# Patient Record
Sex: Male | Born: 1945 | Race: White | Hispanic: No | Marital: Married | Smoking: Former smoker
Health system: Southern US, Community
[De-identification: ages and names within clinical notes are randomized; demographics above are authoritative.]

## PROBLEM LIST (undated history)

## (undated) ENCOUNTER — Ambulatory Visit: Admission: EM | Payer: Medicare Other | Source: Home / Self Care

## (undated) DIAGNOSIS — Z9889 Other specified postprocedural states: Secondary | ICD-10-CM

## (undated) DIAGNOSIS — C801 Malignant (primary) neoplasm, unspecified: Secondary | ICD-10-CM

## (undated) DIAGNOSIS — E785 Hyperlipidemia, unspecified: Secondary | ICD-10-CM

## (undated) DIAGNOSIS — R42 Dizziness and giddiness: Secondary | ICD-10-CM

## (undated) DIAGNOSIS — I10 Essential (primary) hypertension: Secondary | ICD-10-CM

## (undated) DIAGNOSIS — K219 Gastro-esophageal reflux disease without esophagitis: Secondary | ICD-10-CM

## (undated) DIAGNOSIS — E559 Vitamin D deficiency, unspecified: Secondary | ICD-10-CM

## (undated) HISTORY — PX: OTHER SURGICAL HISTORY: SHX169

## (undated) HISTORY — PX: APPENDECTOMY: SHX54

## (undated) HISTORY — PX: RHINOPLASTY: SUR1284

## (undated) HISTORY — PX: TONSILLECTOMY: SUR1361

---

## 2011-06-28 HISTORY — PX: SHOULDER ARTHROSCOPY: SHX128

## 2012-06-27 HISTORY — PX: REVISION UROSTOMY CUTANEOUS: SUR1282

## 2014-06-27 DIAGNOSIS — A491 Streptococcal infection, unspecified site: Secondary | ICD-10-CM

## 2014-06-27 HISTORY — DX: Streptococcal infection, unspecified site: A49.1

## 2016-12-05 ENCOUNTER — Ambulatory Visit
Admission: EM | Admit: 2016-12-05 | Discharge: 2016-12-05 | Disposition: A | Discharge status: Home / Self Care | Payer: Medicare Other | Attending: Family Medicine | Admitting: Family Medicine

## 2016-12-05 DIAGNOSIS — S61216A Laceration without foreign body of right little finger without damage to nail, initial encounter: Secondary | ICD-10-CM

## 2016-12-05 DIAGNOSIS — W540XXA Bitten by dog, initial encounter: Secondary | ICD-10-CM | POA: Diagnosis not present

## 2016-12-05 MED ORDER — AMOXICILLIN-POT CLAVULANATE 875-125 MG PO TABS: 1.0000 | ORAL_TABLET | Freq: Two times a day (BID) | ORAL | 0 refills | Status: DC

## 2016-12-05 MED ORDER — TETANUS-DIPHTH-ACELL PERTUSSIS 5-2.5-18.5 LF-MCG/0.5 IM SUSP
0.5000 mL | Freq: Once | INTRAMUSCULAR | Status: AC
  Administered 2016-12-05: 0.5 mL via INTRAMUSCULAR

## 2016-12-05 NOTE — ED Notes (Signed)
Spoke to animal control and reported the dog bite, they will follow up with the patient.

## 2016-12-05 NOTE — ED Provider Notes (Signed)
[facility]    CSN: [id number] Arrival date & time: 12/05/16  1126     History   Chief Complaint Chief Complaint  Patient presents with  . Animal Bite    HPI Landry Kamath is a 71 y.o. male.   71 yo male presents with a c/o right hand laceration to his pinky finger and left wrist/forearm abrasion due to dog bite last night.  Also has an abrasion to his abdomen from the dog paw. States he was bitten last night by his own dog when he reached to take away something from his mouth. Patient states his dog is up to date on his immunizations. Patient does not recall his own last tetanus shot. States he cleaned the wounds.   The history is provided by the patient.    History reviewed. No pertinent past medical history.  There are no active problems to display for this patient.   History reviewed. No pertinent surgical history.     Home Medications    Prior to Admission medications   Medication Sig Start Date End Date Taking? Authorizing Provider  amoxicillin-clavulanate (AUGMENTIN) 875-125 MG tablet Take 1 tablet by mouth 2 (two) times daily. 12/05/16   Norval Gable, MD    Family History History reviewed. No pertinent family history.  Social History Social History  Substance Use Topics  . Smoking status: Never Smoker  . Smokeless tobacco: Never Used  . Alcohol use No     Allergies   Patient has no known allergies.   Review of Systems Review of Systems   Physical Exam Triage Vital Signs ED Triage Vitals [12/05/16 1251]  Enc Vitals Group     BP 134/64     Pulse Rate 68     Resp 18     Temp 97.9 F (36.6 C)     Temp Source Oral     SpO2 100 %     Weight 165 lb (74.8 kg)     Height 5\' 6"  (1.676 m)     Head Circumference      Peak Flow      Pain Score 8     Pain Loc      Pain Edu?      Excl. in [place]?    No data found.   Updated Vital Signs BP 134/64 (BP Location: Left Arm)   Pulse 68   Temp 97.9 F (36.6 C) (Oral)    Resp 18   Ht 5\' 6"  (1.676 m)   Wt 165 lb (74.8 kg)   SpO2 100%   BMI 26.63 kg/m   Visual Acuity Right Eye Distance:   Left Eye Distance:   Bilateral Distance:    Right Eye Near:   Left Eye Near:    Bilateral Near:     Physical Exam  Constitutional: He appears well-developed and well-nourished. No distress.  Musculoskeletal:       Left forearm: He exhibits laceration (3cm superficial).       Right hand: He exhibits laceration (2cm to 5th finger; normal ROM and stregnth ). He exhibits normal range of motion, no tenderness, no bony tenderness, normal two-point discrimination, normal capillary refill, no deformity and no swelling. Normal sensation noted. Normal strength noted.       Hands: Skin: He is not diaphoretic.  Nursing note and vitals reviewed.    UC Treatments / Results  Labs (all labs ordered are listed, but only abnormal results are displayed) Labs Reviewed - No data to display  EKG  EKG Interpretation None       Radiology No results found.  Procedures Procedures (including critical care time)  Medications Ordered in UC Medications  Tdap (BOOSTRIX) injection 0.5 mL (0.5 mLs Intramuscular Given 12/05/16 1312)     Initial Impression / Assessment and Plan / UC Course  I have reviewed the triage vital signs and the nursing notes.  Pertinent labs & imaging results that were available during my care of the patient were reviewed by me and considered in my medical decision making (see chart for details).       Final Clinical Impressions(s) / UC Diagnoses   Final diagnoses:  Dog bite, initial encounter  Laceration of right little finger without foreign body without damage to nail, initial encounter    New Prescriptions Discharge Medication List as of 12/05/2016  1:28 PM    START taking these medications   Details  amoxicillin-clavulanate (AUGMENTIN) 875-125 MG tablet Take 1 tablet by mouth 2 (two) times daily., Starting Mon 12/05/2016, Normal         1. diagnosis reviewed with patient 2. rx as per orders above; reviewed possible side effects, interactions, risks and benefits  3. Steri strips applied to right small finger laceration with loose approximation of wound edges 4. Recommend supportive treatment with wound care 5. Patient given Tdap 6. Animal control notified 7. Follow-up prn if symptoms worsen or don't improve   Norval Gable, MD 12/05/16 1624

## 2016-12-05 NOTE — ED Triage Notes (Signed)
Pt was bitten by his dog last night on his left wrist, right hand and right pinky finger, and scratch to his abdomen.

## 2018-07-28 ENCOUNTER — Ambulatory Visit
Admission: EM | Admit: 2018-07-28 | Discharge: 2018-07-28 | Disposition: A | Discharge status: Home / Self Care | Payer: Medicare Other | Attending: Family Medicine | Admitting: Family Medicine

## 2018-07-28 DIAGNOSIS — J988 Other specified respiratory disorders: Secondary | ICD-10-CM

## 2018-07-28 DIAGNOSIS — B9789 Other viral agents as the cause of diseases classified elsewhere: Secondary | ICD-10-CM

## 2018-07-28 MED ORDER — BENZONATATE 100 MG PO CAPS: 100.0000 mg | ORAL_CAPSULE | Freq: Three times a day (TID) | ORAL | 0 refills | Status: DC | PRN

## 2018-07-28 MED ORDER — PREDNISONE 50 MG PO TABS: ORAL_TABLET | ORAL | 0 refills | Status: DC
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
## 2018-07-28 NOTE — ED Triage Notes (Signed)
Pt states he has had a cough for 1 week that he cannot seem to get rid of. States its worse in the morning and when he is about to go to sleep. Thinks it has settle into his chest. Has been taking otc cold medication.

## 2018-07-28 NOTE — Discharge Instructions (Signed)
This is viral and will slowly improve.  No need for antibiotics.  Take the prednisone and use the cough medication as needed.  Rest.  Take care  Dr. Lacinda Axon

## 2019-07-08 ENCOUNTER — Other Ambulatory Visit: Payer: Self-pay

## 2019-07-08 ENCOUNTER — Other Ambulatory Visit
Admission: RE | Admit: 2019-07-08 | Discharge: 2019-07-08 | Disposition: A | Discharge status: Home / Self Care | Payer: Medicare Other | Source: Ambulatory Visit | Attending: Internal Medicine | Admitting: Internal Medicine

## 2019-07-08 DIAGNOSIS — Z01812 Encounter for preprocedural laboratory examination: Secondary | ICD-10-CM | POA: Insufficient documentation

## 2019-07-08 DIAGNOSIS — Z20822 Contact with and (suspected) exposure to covid-19: Secondary | ICD-10-CM | POA: Insufficient documentation

## 2019-07-08 LAB — SARS CORONAVIRUS 2 (TAT 6-24 HRS): SARS Coronavirus 2: NEGATIVE

## 2019-07-09 ENCOUNTER — Encounter: Payer: Self-pay | Admitting: Internal Medicine

## 2019-07-10 ENCOUNTER — Ambulatory Visit
Admission: RE | Admit: 2019-07-10 | Discharge: 2019-07-10 | Disposition: A | Discharge status: Home / Self Care | Payer: Medicare Other | Attending: Internal Medicine | Admitting: Internal Medicine

## 2019-07-10 ENCOUNTER — Encounter: Payer: Self-pay | Admitting: Internal Medicine

## 2019-07-10 ENCOUNTER — Encounter
Admission: RE | Disposition: A | Discharge status: Home / Self Care | Payer: Self-pay | Source: Home / Self Care | Attending: Internal Medicine

## 2019-07-10 ENCOUNTER — Other Ambulatory Visit: Payer: Self-pay

## 2019-07-10 ENCOUNTER — Ambulatory Visit: Payer: Medicare Other | Admitting: Registered Nurse

## 2019-07-10 DIAGNOSIS — K5730 Diverticulosis of large intestine without perforation or abscess without bleeding: Secondary | ICD-10-CM | POA: Diagnosis not present

## 2019-07-10 DIAGNOSIS — Z8551 Personal history of malignant neoplasm of bladder: Secondary | ICD-10-CM | POA: Diagnosis not present

## 2019-07-10 DIAGNOSIS — K640 First degree hemorrhoids: Secondary | ICD-10-CM | POA: Insufficient documentation

## 2019-07-10 DIAGNOSIS — K921 Melena: Secondary | ICD-10-CM | POA: Insufficient documentation

## 2019-07-10 DIAGNOSIS — D128 Benign neoplasm of rectum: Secondary | ICD-10-CM | POA: Insufficient documentation

## 2019-07-10 DIAGNOSIS — Z87891 Personal history of nicotine dependence: Secondary | ICD-10-CM | POA: Insufficient documentation

## 2019-07-10 HISTORY — PX: COLONOSCOPY WITH PROPOFOL: SHX5780

## 2019-07-10 HISTORY — DX: Dizziness and giddiness: R42

## 2019-07-10 HISTORY — DX: Vitamin D deficiency, unspecified: E55.9

## 2019-07-10 HISTORY — DX: Malignant (primary) neoplasm, unspecified: C80.1

## 2019-07-10 SURGERY — COLONOSCOPY WITH PROPOFOL
Anesthesia: General

## 2019-07-10 MED ORDER — SODIUM CHLORIDE 0.9 % IV SOLN
INTRAVENOUS | Status: DC
  Administered 2019-07-10: 10:00:00 1000 mL via INTRAVENOUS

## 2019-07-10 MED ORDER — PROPOFOL 500 MG/50ML IV EMUL
INTRAVENOUS | Status: DC | PRN
  Administered 2019-07-10: 150 ug/kg/min via INTRAVENOUS

## 2019-07-10 MED ORDER — PROPOFOL 10 MG/ML IV BOLUS
INTRAVENOUS | Status: DC | PRN
  Administered 2019-07-10: 20 mg via INTRAVENOUS
  Administered 2019-07-10: 70 mg via INTRAVENOUS

## 2019-07-10 MED ORDER — LIDOCAINE HCL (CARDIAC) PF 100 MG/5ML IV SOSY
PREFILLED_SYRINGE | INTRAVENOUS | Status: DC | PRN
  Administered 2019-07-10: 40 mg via INTRAVENOUS

## 2019-07-10 NOTE — Interval H&P Note (Signed)
History and Physical Interval Note:  07/10/2019 11:08 AM  Hector Bryant  has presented today for surgery, with the diagnosis of RECTAL BLEED.  The various methods of treatment have been discussed with the patient and family. After consideration of risks, benefits and other options for treatment, the patient has consented to  Procedure(s): COLONOSCOPY WITH PROPOFOL (N/A) as a surgical intervention.  The patient's history has been reviewed, patient examined, no change in status, stable for surgery.  I have reviewed the patient's chart and labs.  Questions were answered to the patient's satisfaction.     [place], [place]

## 2019-07-10 NOTE — Transfer of Care (Signed)
Immediate Anesthesia Transfer of Care Note  Patient: Hector Bryant  Procedure(s) Performed: Procedure(s): COLONOSCOPY WITH PROPOFOL (N/A)  Patient Location: PACU and Endoscopy Unit  Anesthesia Type:General  Level of Consciousness: sedated  Airway & Oxygen Therapy: Patient Spontanous Breathing and Patient connected to nasal cannula oxygen  Post-op Assessment: Report given to RN and Post -op Vital signs reviewed and stable  Post vital signs: Reviewed and stable  Last Vitals:  Vitals:   07/10/19 1008 07/10/19 1130  BP: (!) 149/76 122/86  Pulse: 72 66  Resp: 18 (!) 21  Temp: (!) 36.3 C (!) 36.1 C  SpO2: 123456 0000000    Complications: No apparent anesthesia complications

## 2019-07-10 NOTE — Op Note (Signed)
[facility] Gastroenterology Patient Name: Hector Bryant Procedure Date: 07/10/2019 11:03 AM MRN: [id number] Account #: [id number] Date of Birth: 13-Sep-1945 Admit Type: Outpatient Age: 74 Room: [facility] ENDO ROOM 3 Gender: Male Note Status: Finalized Procedure:             Colonoscopy Indications:           Hematochezia Providers:             Benay Pike. Alice Reichert MD, MD Referring MD:          Rubbie Battiest. Iona Beard MD, MD (Referring MD) Medicines:             Propofol per Anesthesia Complications:         No immediate complications. Procedure:             Pre-Anesthesia Assessment:                        - The risks and benefits of the procedure and the                         sedation options and risks were discussed with the                         patient. All questions were answered and informed                         consent was obtained.                        - Patient identification and proposed procedure were                         verified prior to the procedure by the nurse. The                         procedure was verified in the procedure room.                        - ASA Grade Assessment: III - A patient with severe                         systemic disease.                        - After reviewing the risks and benefits, the patient                         was deemed in satisfactory condition to undergo the                         procedure.                        After obtaining informed consent, the colonoscope was                         passed under direct vision. Throughout the procedure,                         the patient's blood pressure, pulse, and oxygen  saturations were monitored continuously. The                         Colonoscope was introduced through the anus and                         advanced to the the cecum, identified by appendiceal                         orifice and ileocecal valve. The colonoscopy was                      performed without difficulty. The patient tolerated                         the procedure well. The quality of the bowel                         preparation was excellent. The ileocecal valve,                         appendiceal orifice, and rectum were photographed. Findings:      The perianal and digital rectal examinations were normal. Pertinent       negatives include normal sphincter tone and no palpable rectal lesions.      Non-bleeding internal hemorrhoids were found during retroflexion. The       hemorrhoids were Grade I (internal hemorrhoids that do not prolapse).      A few small-mouthed diverticula were found in the sigmoid colon. There       was no evidence of diverticular bleeding.      A thickened fold was noted in the area of the proximal rectum. Biopsy x       2 was taken to rule out adenoma.      The exam was otherwise without abnormality. Impression:            - Non-bleeding internal hemorrhoids.                        - Mild diverticulosis in the sigmoid colon. There was                         no evidence of diverticular bleeding.                        - The examination was otherwise normal.                        - No specimens collected. Recommendation:        - Patient has a contact number available for                         emergencies. The signs and symptoms of potential                         delayed complications were discussed with the patient.                         Return to normal activities tomorrow. Written  discharge instructions were provided to the patient.                        - Resume previous diet.                        - Continue present medications.                        - Await pathology results.                        - Repeat colonoscopy is recommended for surveillance.                         The colonoscopy date will be determined after                         pathology results from today's  exam become available                         for review.                        - Return to GI office PRN.                        - The findings and recommendations were discussed with                         the patient. Procedure Code(s):     --- Professional ---                        [phone], Colonoscopy, flexible; diagnostic, including                         collection of specimen(s) by brushing or washing, when                         performed (separate procedure) Diagnosis Code(s):     --- Professional ---                        K57.30, Diverticulosis of large intestine without                         perforation or abscess without bleeding                        K92.1, Melena (includes Hematochezia)                        K64.0, First degree hemorrhoids CPT copyright 2019 American Medical Association. All rights reserved. The codes documented in this report are preliminary and upon coder review may  be revised to meet current compliance requirements. Efrain Sella MD, MD 07/10/2019 11:30:56 AM This report has been signed electronically. Number of Addenda: 0 Note Initiated On: 07/10/2019 11:03 AM Scope Withdrawal Time: 0 hours 6 minutes 4 seconds  Total Procedure Duration: 0 hours 8 minutes 28 seconds  Estimated Blood Loss:  Estimated blood loss: none.      [facility]

## 2019-07-10 NOTE — Anesthesia Procedure Notes (Signed)
Date/Time: 07/10/2019 11:10 AM Performed by: Doreen Salvage, CRNA Pre-anesthesia Checklist: Patient identified, Emergency Drugs available, Suction available and Patient being monitored Patient Re-evaluated:Patient Re-evaluated prior to induction Oxygen Delivery Method: Nasal cannula Induction Type: IV induction Dental Injury: Teeth and Oropharynx as per pre-operative assessment  Comments: Nasal cannula with etCO2 monitoring

## 2019-07-10 NOTE — H&P (Signed)
Outpatient short stay form Pre-procedure 07/10/2019 11:05 AM Hector Bryant K. Alice Reichert, M.D.  Primary Physician: Salome Holmes, M.D.  Reason for visit:  Hematochezia  History of present illness:  74 y/o male presents with reported history of recent frank anal and rectal hemorrhage at home that resolved. Last colonoscopy was performed in [place], [place] greater than 5 years ago without reported abnormalities by the patient.     Current Facility-Administered Medications:  .  0.9 %  sodium chloride infusion, , Intravenous, Continuous, [place], Benay Pike, MD, Last Rate: 20 mL/hr at 07/10/19 1027, 1,000 mL at 07/10/19 1027  Medications Prior to Admission  Medication Sig Dispense Refill Last Dose  . atorvastatin (LIPITOR) 20 MG tablet    07/09/2019 at Unknown time  . ergocalciferol (VITAMIN D2) 1.25 MG (50000 UT) capsule Take 50,000 Units by mouth once a week.   Past Week at Unknown time  . ibuprofen (ADVIL) 800 MG tablet Take 800 mg by mouth every 8 (eight) hours as needed.   Past Week at Unknown time  . benzonatate (TESSALON) 100 MG capsule Take 1 capsule (100 mg total) by mouth 3 (three) times daily as needed. (Patient not taking: Reported on 07/10/2019) 30 capsule 0 Not Taking at Unknown time  . predniSONE (DELTASONE) 50 MG tablet 1 tablet daily x 5 days (Patient not taking: Reported on 07/10/2019) 5 tablet 0 Completed Course at Unknown time     No Known Allergies   Past Medical History:  Diagnosis Date  . Cancer (HCC)    BLADDER  . Vertigo   . Vitamin D insufficiency     Review of systems:  Otherwise negative.    Physical Exam  Gen: Alert, oriented. Appears stated age.  HEENT: [place]/AT. PERRLA. Lungs: CTA, no wheezes. CV: RR nl S1, S2. Abd: soft, benign, no masses. BS+ Ext: No edema. Pulses 2+    Planned procedures: Proceed with colonoscopy. The patient understands the nature of the planned procedure, indications, risks, alternatives and potential complications including but not  limited to bleeding, infection, perforation, damage to internal organs and possible oversedation/side effects from anesthesia. The patient agrees and gives consent to proceed.  Please refer to procedure notes for findings, recommendations and patient disposition/instructions.     Verenise Moulin K. Alice Reichert, M.D. Gastroenterology 07/10/2019  11:05 AM

## 2019-07-11 LAB — SURGICAL PATHOLOGY

## 2019-07-11 NOTE — Anesthesia Postprocedure Evaluation (Signed)
Anesthesia Post Note  Patient: Hector Bryant  Procedure(s) Performed: COLONOSCOPY WITH PROPOFOL (N/A )  Patient location during evaluation: PACU Anesthesia Type: General Level of consciousness: awake and alert Pain management: pain level controlled Vital Signs Assessment: post-procedure vital signs reviewed and stable Respiratory status: spontaneous breathing, nonlabored ventilation, respiratory function stable and patient connected to nasal cannula oxygen Cardiovascular status: blood pressure returned to baseline and stable Postop Assessment: no apparent nausea or vomiting Anesthetic complications: no     Last Vitals:  Vitals:   07/10/19 1008 07/10/19 1130  BP: (!) 149/76 122/86  Pulse: 72 66  Resp: 18 (!) 21  Temp: (!) 36.3 C (!) 36.1 C  SpO2: 99% 97%    Last Pain:  Vitals:   07/10/19 1200  TempSrc:   PainSc: 0-No pain                 Molli Barrows
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
## 2019-09-13 ENCOUNTER — Ambulatory Visit
Admission: EM | Admit: 2019-09-13 | Discharge: 2019-09-13 | Disposition: A | Discharge status: Home / Self Care | Payer: Medicare Other | Attending: Urgent Care | Admitting: Urgent Care

## 2019-09-13 ENCOUNTER — Encounter: Payer: Self-pay | Admitting: Emergency Medicine

## 2019-09-13 ENCOUNTER — Other Ambulatory Visit: Payer: Self-pay

## 2019-09-13 DIAGNOSIS — R1011 Right upper quadrant pain: Secondary | ICD-10-CM

## 2019-09-13 DIAGNOSIS — R110 Nausea: Secondary | ICD-10-CM

## 2019-09-13 HISTORY — DX: Hyperlipidemia, unspecified: E78.5

## 2019-09-13 LAB — CBC
HCT: 36.5 % — ABNORMAL LOW (ref 39.0–52.0)
Hemoglobin: 12.8 g/dL — ABNORMAL LOW (ref 13.0–17.0)
MCH: 32.2 pg (ref 26.0–34.0)
MCHC: 35.1 g/dL (ref 30.0–36.0)
MCV: 91.7 fL (ref 80.0–100.0)
Platelets: 234 10*3/uL (ref 150–400)
RBC: 3.98 MIL/uL — ABNORMAL LOW (ref 4.22–5.81)
RDW: 13.5 % (ref 11.5–15.5)
WBC: 5.3 10*3/uL (ref 4.0–10.5)
nRBC: 0 % (ref 0.0–0.2)

## 2019-09-13 LAB — COMPREHENSIVE METABOLIC PANEL
ALT: 24 U/L (ref 0–44)
AST: 42 U/L — ABNORMAL HIGH (ref 15–41)
Albumin: 3.8 g/dL (ref 3.5–5.0)
Alkaline Phosphatase: 39 U/L (ref 38–126)
Anion gap: 8 (ref 5–15)
BUN: 13 mg/dL (ref 8–23)
CO2: 24 mmol/L (ref 22–32)
Calcium: 9.1 mg/dL (ref 8.9–10.3)
Chloride: 107 mmol/L (ref 98–111)
Creatinine, Ser: 1.2 mg/dL (ref 0.61–1.24)
GFR calc Af Amer: >60 mL/min (ref >60)
GFR calc non Af Amer: 59 mL/min — ABNORMAL LOW (ref >60)
Glucose, Bld: 102 mg/dL — ABNORMAL HIGH (ref 70–99)
Potassium: 4.7 mmol/L (ref 3.5–5.1)
Sodium: 139 mmol/L (ref 135–145)
Total Bilirubin: 1.2 mg/dL (ref 0.3–1.2)
Total Protein: 6.9 g/dL (ref 6.5–8.1)

## 2019-09-13 LAB — LIPASE, BLOOD: Lipase: 30 U/L (ref 11–51)

## 2019-09-13 MED ORDER — TRAMADOL HCL 50 MG PO TABS: 50.0000 mg | ORAL_TABLET | Freq: Two times a day (BID) | ORAL | 0 refills | Status: DC | PRN

## 2019-09-13 NOTE — Discharge Instructions (Addendum)
It was very nice seeing you today in clinic. Thank you for entrusting me with your care.   Your labs today were all normal. With the location and duration of your pain, I think that this is likely musculoskeletal in nature. Apply heating pain to area to help with your pain. Continue ibuprofen as needed. Will send in something a little stronger for use for severe pain; be careful with this medication as it can make you sleepy. You can take 1/2 to 1 whole pill twice a day if you need it.   Make arrangements to follow up with your regular doctor in 1 week for re-evaluation if not improving. If your symptoms/condition worsens, please seek follow up care either here or in the ER. Please remember, our [place] providers are "right here with you" when you need [place].   Again, it was my pleasure to take care of you today. Thank you for choosing our clinic. I hope that you start to feel better quickly.   Honor Loh, MSN, APRN, FNP-C, CEN Advanced Practice Provider [facility]

## 2019-09-13 NOTE — ED Triage Notes (Signed)
Patient in today c/o RUQ abdominal pain x 2 weeks. 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 Patient states he feels slightly nauseous. Denies any changes in bowel habits or urinary symptoms.
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
## 2020-02-28 ENCOUNTER — Other Ambulatory Visit: Payer: Self-pay | Admitting: Orthopedic Surgery

## 2020-02-28 DIAGNOSIS — M2392 Unspecified internal derangement of left knee: Secondary | ICD-10-CM

## 2020-02-28 DIAGNOSIS — M1712 Unilateral primary osteoarthritis, left knee: Secondary | ICD-10-CM

## 2020-03-20 ENCOUNTER — Other Ambulatory Visit: Payer: Self-pay

## 2020-03-20 ENCOUNTER — Ambulatory Visit
Admission: RE | Admit: 2020-03-20 | Discharge: 2020-03-20 | Disposition: A | Discharge status: Home / Self Care | Payer: Medicare Other | Source: Ambulatory Visit | Attending: Orthopedic Surgery | Admitting: Orthopedic Surgery

## 2020-03-20 DIAGNOSIS — M2392 Unspecified internal derangement of left knee: Secondary | ICD-10-CM | POA: Diagnosis not present

## 2020-03-20 DIAGNOSIS — M1712 Unilateral primary osteoarthritis, left knee: Secondary | ICD-10-CM | POA: Diagnosis present

## 2020-06-04 ENCOUNTER — Other Ambulatory Visit: Payer: Self-pay | Admitting: Surgery

## 2020-06-09 ENCOUNTER — Other Ambulatory Visit: Payer: Self-pay

## 2020-06-09 ENCOUNTER — Encounter
Admission: RE | Admit: 2020-06-09 | Discharge: 2020-06-09 | Disposition: A | Discharge status: Home / Self Care | Payer: Medicare Other | Source: Ambulatory Visit | Attending: Surgery | Admitting: Surgery

## 2020-06-09 DIAGNOSIS — Z01818 Encounter for other preprocedural examination: Secondary | ICD-10-CM | POA: Insufficient documentation

## 2020-06-09 LAB — COMPREHENSIVE METABOLIC PANEL
ALT: 21 U/L (ref 0–44)
AST: 23 U/L (ref 15–41)
Albumin: 4.5 g/dL (ref 3.5–5.0)
Alkaline Phosphatase: 34 U/L — ABNORMAL LOW (ref 38–126)
Anion gap: 9 (ref 5–15)
BUN: 18 mg/dL (ref 8–23)
CO2: 26 mmol/L (ref 22–32)
Calcium: 9.8 mg/dL (ref 8.9–10.3)
Chloride: 105 mmol/L (ref 98–111)
Creatinine, Ser: 1.05 mg/dL (ref 0.61–1.24)
GFR, Estimated: >60 mL/min (ref >60)
Glucose, Bld: 91 mg/dL (ref 70–99)
Potassium: 4 mmol/L (ref 3.5–5.1)
Sodium: 140 mmol/L (ref 135–145)
Total Bilirubin: 1 mg/dL (ref 0.3–1.2)
Total Protein: 8 g/dL (ref 6.5–8.1)

## 2020-06-09 LAB — CBC WITH DIFFERENTIAL/PLATELET
Abs Immature Granulocytes: 0.05 10*3/uL (ref 0.00–0.07)
Basophils Absolute: 0.1 10*3/uL (ref 0.0–0.1)
Basophils Relative: 1 %
Eosinophils Absolute: 0.2 10*3/uL (ref 0.0–0.5)
Eosinophils Relative: 3 %
HCT: 41 % (ref 39.0–52.0)
Hemoglobin: 14 g/dL (ref 13.0–17.0)
Immature Granulocytes: 1 %
Lymphocytes Relative: 25 %
Lymphs Abs: 1.5 10*3/uL (ref 0.7–4.0)
MCH: 31.7 pg (ref 26.0–34.0)
MCHC: 34.1 g/dL (ref 30.0–36.0)
MCV: 93 fL (ref 80.0–100.0)
Monocytes Absolute: 0.6 10*3/uL (ref 0.1–1.0)
Monocytes Relative: 10 %
Neutro Abs: 3.7 10*3/uL (ref 1.7–7.7)
Neutrophils Relative %: 60 %
Platelets: 197 10*3/uL (ref 150–400)
RBC: 4.41 MIL/uL (ref 4.22–5.81)
RDW: 13.4 % (ref 11.5–15.5)
WBC: 6.2 10*3/uL (ref 4.0–10.5)
nRBC: 0 % (ref 0.0–0.2)

## 2020-06-09 LAB — URINALYSIS, ROUTINE W REFLEX MICROSCOPIC
Bilirubin Urine: NEGATIVE
Glucose, UA: NEGATIVE mg/dL
Hgb urine dipstick: NEGATIVE
Ketones, ur: NEGATIVE mg/dL
Nitrite: POSITIVE — AB
Protein, ur: 100 mg/dL — AB
Specific Gravity, Urine: 1.019 (ref 1.005–1.030)
Squamous Epithelial / HPF: NONE SEEN (ref 0–5)
pH: 8 (ref 5.0–8.0)

## 2020-06-09 NOTE — Progress Notes (Signed)
  [facility] Perioperative Services: Pre-Admission/Anesthesia Testing  Abnormal Lab Notification   Date: 06/09/20  Name: Hector Bryant MRN:   [id number]  Re: Abnormal labs noted during PAT appointment   Provider(s) Notified: Poggi, Marshall Cork, MD Notification mode: Routed and/or faxed via [place] LAB VALUE(S): Lab Results  Component Value Date   COLORURINE YELLOW (A) 06/09/2020   APPEARANCEUR CLOUDY (A) 06/09/2020   LABSPEC 1.019 06/09/2020   PHURINE 8.0 06/09/2020   GLUCOSEU NEGATIVE 06/09/2020   HGBUR NEGATIVE 06/09/2020   BILIRUBINUR NEGATIVE 06/09/2020   KETONESUR NEGATIVE 06/09/2020   PROTEINUR 100 (A) 06/09/2020   NITRITE POSITIVE (A) 06/09/2020   LEUKOCYTESUR TRACE (A) 06/09/2020   EPIU NONE SEEN 06/09/2020   WBCU 21-50 06/09/2020   RBCU 11-20 06/09/2020   BACTERIA MANY (A) 06/09/2020   Notes:  Patient scheduled for LEFT PARTIAL KNEE REPLACEMENT (Left Knee) on 06/17/2020.  UA performed in PAT concerning for infection.   Patient has a urostomy.  . No leukocytosis noted on CBC; WBC 6.2 K/uL.  Marland Kitchen Renal function normal; BUN 18 and creatinine 1.05 mg/dL.  Marland Kitchen Urine C&S added to assess for pathogenically significant growth.  Will forward UA, and subsequent C&S results, to attending surgeon for review and Tx as deemed appropriate. This is a Community education officer; no formal response is required.  Honor Loh, MSN, APRN, FNP-C, CEN [facility]  Peri-operative Services Nurse Practitioner Phone: [phone] Fax: [phone] 06/09/20 1:47 PM

## 2020-06-09 NOTE — Patient Instructions (Addendum)
Your procedure is scheduled on: Wednesday June 17, 2020  Report to Day Surgery inside [facility] 2nd floor (stop by Registration desk first) To find out your arrival time please call [phone] between 1PM - 3PM on Tuesday June 16, 2020.  Remember: Instructions that are not followed completely may result in serious medical risk,  up to and including death, or upon the discretion of your surgeon and anesthesiologist your  surgery may need to be rescheduled.     _X__ 1. Do not eat food after midnight the night before your procedure.                 No chewing gum or hard candies. You may drink clear liquids up to 2 hours                 before you are scheduled to arrive for your surgery- DO not drink clear                 liquids within 2 hours of the start of your surgery.                 Clear Liquids include:  water, apple juice without pulp, clear Gatorade, G2 or                  Gatorade Zero (avoid Red/Purple/Blue), Black Coffee or Tea (Do not add                 anything to coffee or tea).  __X__2.   Complete the "Ensure Clear Pre-surgery Clear Carbohydrate Drink" provided to you, 2 hours before arrival. **If you are diabetic you will be provided with an alternative drink, Gatorade Zero or G2.  __X__3.  On the morning of surgery brush your teeth with toothpaste and water, you                may rinse your mouth with mouthwash if you wish.  Do not swallow any toothpaste of mouthwash.     _X__ 4.  No Alcohol for 24 hours before or after surgery.   _X__ 5.  Do Not Smoke or use e-cigarettes For 24 Hours Prior to Your Surgery.                 Do not use any chewable tobacco products for at least 6 hours prior to                 Surgery.  _X__  6.  Do not use any recreational drugs (marijuana, cocaine, heroin, ecstasy, MDMA or other)                For at least one week prior to your surgery.  Combination of these drugs with anesthesia                 May have life threatening results.  __X__  7.  Notify your doctor if there is any change in your medical condition      (cold, fever, infections).     Do not wear jewelry, make-up, hairpins, clips or nail polish. Do not wear lotions, powders, or perfumes. You may wear deodorant. Do not shave 48 hours prior to surgery. Men may shave face and neck. Do not bring valuables to the hospital.    [facility] is not responsible for any belongings or valuables.  Contacts, dentures or bridgework may not be worn into surgery. Leave your suitcase in the car. After surgery it may  be brought to your room. For patients admitted to the hospital, discharge time is determined by your treatment team.   Patients discharged the day of surgery will not be allowed to drive home.   Make arrangements for someone to be with you for the first 24 hours of your Same Day Discharge.    Please read over the following fact sheets that you were given:   Incentive Spirometry sheet  __x__ Take these medicines the morning of surgery with A SIP OF WATER:    1. None   ____ Fleet Enema (as directed)   __x__ Use CHG Soap (or wipes) as directed  ____ Use Benzoyl Peroxide Gel as instructed  ____ Use inhalers on the day of surgery  ____ Stop metformin 2 days prior to surgery    ____ Take 1/2 of usual insulin dose the night before surgery. No insulin the morning          of surgery.   ____ Stop Coumadin/Plavix/aspirin   __x__ Stop Anti-inflammatories such as Ibuprofen, Aleve, Advil, naproxen, aspirin and or BC powders.    __x__ Stop supplements until after surgery.    __x__ Do no start any herbal supplements before your procedure.    If you have any questions regarding your pre-procedure instructions,  Please call Pre-admit Testing at [phone]. 1

## 2020-06-10 LAB — URINE CULTURE

## 2020-06-15 ENCOUNTER — Other Ambulatory Visit
Admission: RE | Admit: 2020-06-15 | Discharge: 2020-06-15 | Disposition: A | Discharge status: Home / Self Care | Payer: Medicare Other | Source: Ambulatory Visit | Attending: Surgery | Admitting: Surgery

## 2020-06-15 ENCOUNTER — Other Ambulatory Visit: Payer: Self-pay

## 2020-06-15 DIAGNOSIS — Z20822 Contact with and (suspected) exposure to covid-19: Secondary | ICD-10-CM | POA: Diagnosis not present

## 2020-06-15 DIAGNOSIS — Z01812 Encounter for preprocedural laboratory examination: Secondary | ICD-10-CM | POA: Diagnosis present

## 2020-06-15 LAB — SARS CORONAVIRUS 2 (TAT 6-24 HRS): SARS Coronavirus 2: NEGATIVE

## 2020-06-17 ENCOUNTER — Ambulatory Visit: Payer: Medicare Other | Admitting: Urgent Care

## 2020-06-17 ENCOUNTER — Ambulatory Visit: Payer: Medicare Other

## 2020-06-17 ENCOUNTER — Other Ambulatory Visit: Payer: Self-pay

## 2020-06-17 ENCOUNTER — Encounter
Admission: RE | Disposition: A | Discharge status: Home Health | Payer: Self-pay | Source: Home / Self Care | Attending: Surgery

## 2020-06-17 ENCOUNTER — Ambulatory Visit
Admission: RE | Admit: 2020-06-17 | Discharge: 2020-06-18 | Disposition: A | Discharge status: Home Health | Payer: Medicare Other | Attending: Surgery | Admitting: Surgery

## 2020-06-17 ENCOUNTER — Encounter: Payer: Self-pay | Admitting: Surgery

## 2020-06-17 DIAGNOSIS — M1712 Unilateral primary osteoarthritis, left knee: Secondary | ICD-10-CM | POA: Insufficient documentation

## 2020-06-17 DIAGNOSIS — Z96652 Presence of left artificial knee joint: Secondary | ICD-10-CM

## 2020-06-17 DIAGNOSIS — S83242A Other tear of medial meniscus, current injury, left knee, initial encounter: Secondary | ICD-10-CM | POA: Diagnosis not present

## 2020-06-17 DIAGNOSIS — X58XXXA Exposure to other specified factors, initial encounter: Secondary | ICD-10-CM | POA: Diagnosis not present

## 2020-06-17 DIAGNOSIS — Z791 Long term (current) use of non-steroidal anti-inflammatories (NSAID): Secondary | ICD-10-CM | POA: Insufficient documentation

## 2020-06-17 DIAGNOSIS — Z79899 Other long term (current) drug therapy: Secondary | ICD-10-CM | POA: Insufficient documentation

## 2020-06-17 DIAGNOSIS — Z87891 Personal history of nicotine dependence: Secondary | ICD-10-CM | POA: Diagnosis not present

## 2020-06-17 HISTORY — PX: PARTIAL KNEE ARTHROPLASTY: SHX2174

## 2020-06-17 SURGERY — ARTHROPLASTY, KNEE, UNICOMPARTMENTAL
Anesthesia: Monitor Anesthesia Care | Site: Knee | Laterality: Left

## 2020-06-17 MED ORDER — DOCUSATE SODIUM 100 MG PO CAPS
100.0000 mg | ORAL_CAPSULE | Freq: Two times a day (BID) | ORAL | Status: DC
  Administered 2020-06-17 – 2020-06-18 (×2): 100 mg via ORAL
  Filled 2020-06-17 (×3): qty 1

## 2020-06-17 MED ORDER — LACTATED RINGERS IV SOLN: INTRAVENOUS | Status: DC

## 2020-06-17 MED ORDER — OXYCODONE HCL 5 MG PO TABS
5.0000 mg | ORAL_TABLET | ORAL | Status: DC | PRN
  Administered 2020-06-17 (×2): 5 mg via ORAL

## 2020-06-17 MED ORDER — OXYCODONE HCL 5 MG PO TABS
ORAL_TABLET | ORAL | Status: AC
  Filled 2020-06-17: qty 1

## 2020-06-17 MED ORDER — BUPIVACAINE HCL (PF) 0.5 % IJ SOLN
INTRAMUSCULAR | Status: AC
  Filled 2020-06-17: qty 10

## 2020-06-17 MED ORDER — HYDROMORPHONE HCL 1 MG/ML IJ SOLN
INTRAMUSCULAR | Status: AC
  Administered 2020-06-17: 17:00:00 0.5 mg via INTRAVENOUS
  Filled 2020-06-17: qty 1

## 2020-06-17 MED ORDER — ACETAMINOPHEN 325 MG PO TABS: 325.0000 mg | ORAL_TABLET | Freq: Four times a day (QID) | ORAL | Status: DC | PRN

## 2020-06-17 MED ORDER — TRAMADOL HCL 50 MG PO TABS
50.0000 mg | ORAL_TABLET | Freq: Four times a day (QID) | ORAL | Status: DC | PRN
  Administered 2020-06-18: 50 mg via ORAL
  Filled 2020-06-17: qty 1

## 2020-06-17 MED ORDER — HYDROMORPHONE HCL 1 MG/ML IJ SOLN
INTRAMUSCULAR | Status: AC
  Filled 2020-06-17: qty 0.5

## 2020-06-17 MED ORDER — FENTANYL CITRATE (PF) 100 MCG/2ML IJ SOLN
INTRAMUSCULAR | Status: DC | PRN
  Administered 2020-06-17 (×4): 25 ug via INTRAVENOUS

## 2020-06-17 MED ORDER — CHLORHEXIDINE GLUCONATE 0.12 % MT SOLN: 15.0000 mL | Freq: Once | OROMUCOSAL | Status: AC

## 2020-06-17 MED ORDER — SODIUM CHLORIDE 0.9 % BOLUS PEDS
250.0000 mL | Freq: Once | INTRAVENOUS | Status: AC
  Administered 2020-06-17: 10:00:00 250 mL via INTRAVENOUS

## 2020-06-17 MED ORDER — KETOROLAC TROMETHAMINE 30 MG/ML IJ SOLN
15.0000 mg | Freq: Once | INTRAMUSCULAR | Status: AC
  Administered 2020-06-17: 16:00:00 15 mg via INTRAVENOUS

## 2020-06-17 MED ORDER — ONDANSETRON HCL 4 MG PO TABS: 4.0000 mg | ORAL_TABLET | Freq: Four times a day (QID) | ORAL | Status: DC | PRN

## 2020-06-17 MED ORDER — PROPOFOL 10 MG/ML IV BOLUS
INTRAVENOUS | Status: DC | PRN
  Administered 2020-06-17 (×2): 50 mg via INTRAVENOUS
  Administered 2020-06-17: 20 mg via INTRAVENOUS

## 2020-06-17 MED ORDER — ONDANSETRON HCL 4 MG/2ML IJ SOLN
INTRAMUSCULAR | Status: DC | PRN
  Administered 2020-06-17: 4 mg via INTRAVENOUS

## 2020-06-17 MED ORDER — CHLORHEXIDINE GLUCONATE 0.12 % MT SOLN
OROMUCOSAL | Status: AC
  Administered 2020-06-17: 07:00:00 15 mL via OROMUCOSAL
  Filled 2020-06-17: qty 15

## 2020-06-17 MED ORDER — METOCLOPRAMIDE HCL 5 MG/ML IJ SOLN: 5.0000 mg | Freq: Three times a day (TID) | INTRAMUSCULAR | Status: DC | PRN

## 2020-06-17 MED ORDER — LIDOCAINE HCL (CARDIAC) PF 100 MG/5ML IV SOSY
PREFILLED_SYRINGE | INTRAVENOUS | Status: DC | PRN
  Administered 2020-06-17: 50 mg via INTRAVENOUS

## 2020-06-17 MED ORDER — DIPHENHYDRAMINE HCL 12.5 MG/5ML PO ELIX
12.5000 mg | ORAL_SOLUTION | ORAL | Status: DC | PRN
  Filled 2020-06-17: qty 10

## 2020-06-17 MED ORDER — PROPOFOL 500 MG/50ML IV EMUL
INTRAVENOUS | Status: DC | PRN
  Administered 2020-06-17: 125 ug/kg/min via INTRAVENOUS

## 2020-06-17 MED ORDER — TRANEXAMIC ACID-NACL 1000-0.7 MG/100ML-% IV SOLN
INTRAVENOUS | Status: AC | PRN
  Administered 2020-06-17: 1000 mg via INTRAVENOUS

## 2020-06-17 MED ORDER — ACETAMINOPHEN 10 MG/ML IV SOLN
INTRAVENOUS | Status: AC
  Filled 2020-06-17: qty 100

## 2020-06-17 MED ORDER — MIDAZOLAM HCL 5 MG/5ML IJ SOLN
INTRAMUSCULAR | Status: DC | PRN
  Administered 2020-06-17 (×2): 1 mg via INTRAVENOUS

## 2020-06-17 MED ORDER — ONDANSETRON HCL 4 MG/2ML IJ SOLN: 4.0000 mg | Freq: Once | INTRAMUSCULAR | Status: DC | PRN

## 2020-06-17 MED ORDER — CEFAZOLIN SODIUM-DEXTROSE 2-4 GM/100ML-% IV SOLN
2.0000 g | Freq: Four times a day (QID) | INTRAVENOUS | Status: AC
  Administered 2020-06-17 – 2020-06-18 (×2): 2 g via INTRAVENOUS
  Filled 2020-06-17 (×2): qty 100

## 2020-06-17 MED ORDER — ACETAMINOPHEN 500 MG PO TABS
1000.0000 mg | ORAL_TABLET | Freq: Four times a day (QID) | ORAL | Status: DC
  Administered 2020-06-17 (×3): 1000 mg via ORAL
  Filled 2020-06-17 (×3): qty 2

## 2020-06-17 MED ORDER — BUPIVACAINE-EPINEPHRINE (PF) 0.5% -1:200000 IJ SOLN
INTRAMUSCULAR | Status: AC
  Filled 2020-06-17: qty 30

## 2020-06-17 MED ORDER — KETOROLAC TROMETHAMINE 30 MG/ML IJ SOLN
INTRAMUSCULAR | Status: AC
  Filled 2020-06-17: qty 1

## 2020-06-17 MED ORDER — SODIUM CHLORIDE 0.9 % BOLUS PEDS
250.0000 mL | Freq: Once | INTRAVENOUS | Status: AC
  Administered 2020-06-17: 14:00:00 250 mL via INTRAVENOUS

## 2020-06-17 MED ORDER — SODIUM CHLORIDE FLUSH 0.9 % IV SOLN
INTRAVENOUS | Status: AC
  Filled 2020-06-17: qty 20

## 2020-06-17 MED ORDER — ATORVASTATIN CALCIUM 20 MG PO TABS
20.0000 mg | ORAL_TABLET | Freq: Every day | ORAL | Status: DC
  Administered 2020-06-17: 22:00:00 20 mg via ORAL
  Filled 2020-06-17: qty 1

## 2020-06-17 MED ORDER — FENTANYL CITRATE (PF) 100 MCG/2ML IJ SOLN
INTRAMUSCULAR | Status: AC
  Administered 2020-06-17: 16:00:00 25 ug via INTRAVENOUS
  Filled 2020-06-17: qty 2

## 2020-06-17 MED ORDER — FLEET ENEMA 7-19 GM/118ML RE ENEM: 1.0000 | ENEMA | Freq: Once | RECTAL | Status: DC | PRN

## 2020-06-17 MED ORDER — PROPOFOL 10 MG/ML IV BOLUS
INTRAVENOUS | Status: AC
  Filled 2020-06-17: qty 40

## 2020-06-17 MED ORDER — OXYCODONE HCL 5 MG PO TABS
5.0000 mg | ORAL_TABLET | ORAL | 0 refills | Status: DC | PRN
Start: 2020-06-17 — End: 2020-06-18

## 2020-06-17 MED ORDER — ACETAMINOPHEN 500 MG PO TABS
ORAL_TABLET | ORAL | Status: AC
  Filled 2020-06-17: qty 2

## 2020-06-17 MED ORDER — PROPOFOL 500 MG/50ML IV EMUL
INTRAVENOUS | Status: AC
  Filled 2020-06-17: qty 50

## 2020-06-17 MED ORDER — KETOROLAC TROMETHAMINE 15 MG/ML IJ SOLN
7.5000 mg | Freq: Four times a day (QID) | INTRAMUSCULAR | Status: DC
  Administered 2020-06-17 – 2020-06-18 (×3): 7.5 mg via INTRAVENOUS
  Filled 2020-06-17 (×3): qty 1

## 2020-06-17 MED ORDER — MAGNESIUM HYDROXIDE 400 MG/5ML PO SUSP: 30.0000 mL | Freq: Every day | ORAL | Status: DC | PRN

## 2020-06-17 MED ORDER — OXYCODONE HCL 5 MG PO TABS
5.0000 mg | ORAL_TABLET | ORAL | Status: DC | PRN
  Administered 2020-06-18: 10 mg via ORAL
  Filled 2020-06-17 (×2): qty 2

## 2020-06-17 MED ORDER — CYANOCOBALAMIN 500 MCG PO TABS
500.0000 ug | ORAL_TABLET | Freq: Every day | ORAL | Status: DC
  Administered 2020-06-17: 22:00:00 500 ug via ORAL
  Filled 2020-06-17 (×2): qty 1

## 2020-06-17 MED ORDER — BUPIVACAINE HCL (PF) 0.5 % IJ SOLN
INTRAMUSCULAR | Status: DC | PRN
  Administered 2020-06-17: 2.5 mL

## 2020-06-17 MED ORDER — FENTANYL CITRATE (PF) 100 MCG/2ML IJ SOLN
25.0000 ug | INTRAMUSCULAR | Status: DC | PRN
  Administered 2020-06-17 (×2): 25 ug via INTRAVENOUS

## 2020-06-17 MED ORDER — ORAL CARE MOUTH RINSE: 15.0000 mL | Freq: Once | OROMUCOSAL | Status: AC

## 2020-06-17 MED ORDER — ACETAMINOPHEN 10 MG/ML IV SOLN
INTRAVENOUS | Status: DC | PRN
  Administered 2020-06-17: 1000 mg via INTRAVENOUS

## 2020-06-17 MED ORDER — METOCLOPRAMIDE HCL 10 MG PO TABS: 5.0000 mg | ORAL_TABLET | Freq: Three times a day (TID) | ORAL | Status: DC | PRN

## 2020-06-17 MED ORDER — CEFAZOLIN SODIUM-DEXTROSE 2-4 GM/100ML-% IV SOLN
INTRAVENOUS | Status: AC
  Filled 2020-06-17: qty 100

## 2020-06-17 MED ORDER — POTASSIUM CHLORIDE IN NACL 20-0.9 MEQ/L-% IV SOLN
INTRAVENOUS | Status: DC
  Filled 2020-06-17 (×8): qty 1000

## 2020-06-17 MED ORDER — SODIUM CHLORIDE 0.9 % IV SOLN
INTRAVENOUS | Status: DC | PRN
  Administered 2020-06-17: 09:00:00 70 mL

## 2020-06-17 MED ORDER — FENTANYL CITRATE (PF) 100 MCG/2ML IJ SOLN
INTRAMUSCULAR | Status: AC
Start: 2020-06-17 — End: 2020-06-18
  Filled 2020-06-17: qty 2

## 2020-06-17 MED ORDER — FAMOTIDINE 20 MG PO TABS: 20.0000 mg | ORAL_TABLET | Freq: Once | ORAL | Status: AC

## 2020-06-17 MED ORDER — APIXABAN 2.5 MG PO TABS: 2.5000 mg | ORAL_TABLET | Freq: Two times a day (BID) | ORAL | 0 refills | Status: DC

## 2020-06-17 MED ORDER — FENTANYL CITRATE (PF) 100 MCG/2ML IJ SOLN
INTRAMUSCULAR | Status: AC
  Filled 2020-06-17: qty 2

## 2020-06-17 MED ORDER — ONDANSETRON HCL 4 MG/2ML IJ SOLN: 4.0000 mg | Freq: Four times a day (QID) | INTRAMUSCULAR | Status: DC | PRN

## 2020-06-17 MED ORDER — HYDROMORPHONE HCL 1 MG/ML IJ SOLN: 0.5000 mg | INTRAMUSCULAR | Status: DC | PRN

## 2020-06-17 MED ORDER — CEFAZOLIN SODIUM-DEXTROSE 2-4 GM/100ML-% IV SOLN
INTRAVENOUS | Status: AC
  Administered 2020-06-17: 14:00:00 2 g via INTRAVENOUS
  Filled 2020-06-17: qty 100

## 2020-06-17 MED ORDER — TRANEXAMIC ACID 1000 MG/10ML IV SOLN
INTRAVENOUS | Status: AC
  Filled 2020-06-17: qty 10

## 2020-06-17 MED ORDER — BUPIVACAINE LIPOSOME 1.3 % IJ SUSP
INTRAMUSCULAR | Status: AC
  Filled 2020-06-17: qty 20

## 2020-06-17 MED ORDER — OXYCODONE HCL 5 MG PO TABS: 5.0000 mg | ORAL_TABLET | ORAL | Status: DC | PRN

## 2020-06-17 MED ORDER — BISACODYL 10 MG RE SUPP
10.0000 mg | Freq: Every day | RECTAL | Status: DC | PRN
  Filled 2020-06-17: qty 1

## 2020-06-17 MED ORDER — FAMOTIDINE 20 MG PO TABS
ORAL_TABLET | ORAL | Status: AC
  Administered 2020-06-17: 07:00:00 20 mg via ORAL
  Filled 2020-06-17: qty 1

## 2020-06-17 MED ORDER — MIDAZOLAM HCL 2 MG/2ML IJ SOLN
INTRAMUSCULAR | Status: AC
  Filled 2020-06-17: qty 2

## 2020-06-17 MED ORDER — FENTANYL CITRATE (PF) 100 MCG/2ML IJ SOLN
INTRAMUSCULAR | Status: AC
  Administered 2020-06-17: 10:00:00 50 ug via INTRAVENOUS
  Filled 2020-06-17: qty 2

## 2020-06-17 MED ORDER — MEPERIDINE HCL 50 MG/ML IJ SOLN: 6.2500 mg | INTRAMUSCULAR | Status: DC | PRN

## 2020-06-17 MED ORDER — CEFAZOLIN SODIUM-DEXTROSE 2-4 GM/100ML-% IV SOLN
2.0000 g | INTRAVENOUS | Status: AC
  Administered 2020-06-17: 08:00:00 2 g via INTRAVENOUS

## 2020-06-17 SURGICAL SUPPLY — 66 items
BEARING TIBIAL OXFORD MED 4 (Orthopedic Implant) ×2 IMPLANT
BEARING TIBIAL OXFORD MED 4MM (Orthopedic Implant) ×1 IMPLANT
BNDG ELASTIC 6X5.8 VLCR STR LF (GAUZE/BANDAGES/DRESSINGS) ×3 IMPLANT
CANISTER SUCT 3000ML PPV (MISCELLANEOUS) ×3 IMPLANT
CEMENT BONE R 1X40 (Cement) ×3 IMPLANT
CEMENT VACUUM MIXING SYSTEM (MISCELLANEOUS) ×3 IMPLANT
CHLORAPREP W/TINT 26 (MISCELLANEOUS) ×6 IMPLANT
COOLER POLAR GLACIER W/PUMP (MISCELLANEOUS) ×3 IMPLANT
COVER MAYO STAND REUSABLE (DRAPES) ×3 IMPLANT
COVER WAND RF STERILE (DRAPES) ×3 IMPLANT
CUFF TOURN SGL QUICK 24 (TOURNIQUET CUFF)
CUFF TOURN SGL QUICK 30 (TOURNIQUET CUFF) ×2
CUFF TOURN SGL QUICK 34 (TOURNIQUET CUFF)
CUFF TRNQT CYL 24X4X16.5-23 (TOURNIQUET CUFF) IMPLANT
CUFF TRNQT CYL 30X4X21-28X (TOURNIQUET CUFF) ×1 IMPLANT
CUFF TRNQT CYL 34X4.125X (TOURNIQUET CUFF) IMPLANT
DRAPE C-ARM XRAY 36X54 (DRAPES) IMPLANT
DRSG MEPILEX SACRM 8.7X9.8 (GAUZE/BANDAGES/DRESSINGS) ×3 IMPLANT
DRSG OPSITE POSTOP 4X12 (GAUZE/BANDAGES/DRESSINGS) ×3 IMPLANT
DRSG OPSITE POSTOP 4X6 (GAUZE/BANDAGES/DRESSINGS) ×3 IMPLANT
ELECT CAUTERY BLADE 6.4 (BLADE) IMPLANT
ELECT REM PT RETURN 9FT ADLT (ELECTROSURGICAL) ×3
ELECTRODE REM PT RTRN 9FT ADLT (ELECTROSURGICAL) ×1 IMPLANT
GAUZE 4X4 16PLY RFD (DISPOSABLE) ×3 IMPLANT
GAUZE SPONGE 4X4 12PLY STRL (GAUZE/BANDAGES/DRESSINGS) ×3 IMPLANT
GAUZE XEROFORM 1X8 LF (GAUZE/BANDAGES/DRESSINGS) ×3 IMPLANT
GLOVE BIO SURGEON STRL SZ7.5 (GLOVE) ×12 IMPLANT
GLOVE BIO SURGEON STRL SZ8 (GLOVE) ×12 IMPLANT
GLOVE BIOGEL PI IND STRL 8 (GLOVE) ×1 IMPLANT
GLOVE BIOGEL PI INDICATOR 8 (GLOVE) ×2
GLOVE INDICATOR 8.0 STRL GRN (GLOVE) ×3 IMPLANT
GOWN STRL REUS W/ TWL LRG LVL3 (GOWN DISPOSABLE) ×1 IMPLANT
GOWN STRL REUS W/ TWL XL LVL3 (GOWN DISPOSABLE) ×1 IMPLANT
GOWN STRL REUS W/TWL LRG LVL3 (GOWN DISPOSABLE) ×2
GOWN STRL REUS W/TWL XL LVL3 (GOWN DISPOSABLE) ×2
HOOD PEEL AWAY FLYTE STAYCOOL (MISCELLANEOUS) ×9 IMPLANT
KIT TURNOVER KIT A (KITS) ×3 IMPLANT
MANIFOLD NEPTUNE II (INSTRUMENTS) ×3 IMPLANT
MAT ABSORB  FLUID 56X50 GRAY (MISCELLANEOUS) ×2
MAT ABSORB FLUID 56X50 GRAY (MISCELLANEOUS) ×1 IMPLANT
NDL SAFETY ECLIPSE 18X1.5 (NEEDLE) ×1 IMPLANT
NEEDLE HYPO 18GX1.5 SHARP (NEEDLE) ×2
NEEDLE SPNL 20GX3.5 QUINCKE YW (NEEDLE) ×3 IMPLANT
NS IRRIG 1000ML POUR BTL (IV SOLUTION) ×3 IMPLANT
PACK BLADE SAW RECIP 70 3 PT (BLADE) ×3 IMPLANT
PACK TOTAL KNEE (MISCELLANEOUS) ×3 IMPLANT
PAD ABD DERMACEA PRESS 5X9 (GAUZE/BANDAGES/DRESSINGS) ×6 IMPLANT
PAD WRAPON POLAR KNEE (MISCELLANEOUS) ×1 IMPLANT
PEG TWIN FEM CEMENTED MED (Knees) ×3 IMPLANT
PULSAVAC PLUS IRRIG FAN TIP (DISPOSABLE) ×3
SOL .9 NS 3000ML IRR  AL (IV SOLUTION) ×2
SOL .9 NS 3000ML IRR UROMATIC (IV SOLUTION) ×1 IMPLANT
STAPLER SKIN PROX 35W (STAPLE) ×3 IMPLANT
STRAP SAFETY 5IN WIDE (MISCELLANEOUS) ×3 IMPLANT
SUCTION FRAZIER HANDLE 10FR (MISCELLANEOUS) ×2
SUCTION TUBE FRAZIER 10FR DISP (MISCELLANEOUS) ×1 IMPLANT
SUT VIC AB 0 CT1 36 (SUTURE) ×3 IMPLANT
SUT VIC AB 2-0 CT1 27 (SUTURE) ×8
SUT VIC AB 2-0 CT1 TAPERPNT 27 (SUTURE) ×4 IMPLANT
SYR 10ML LL (SYRINGE) ×3 IMPLANT
SYR 20ML LL LF (SYRINGE) ×3 IMPLANT
SYR 30ML LL (SYRINGE) ×9 IMPLANT
TAPE TRANSPORE STRL 2 31045 (GAUZE/BANDAGES/DRESSINGS) ×3 IMPLANT
TIP FAN IRRIG PULSAVAC PLUS (DISPOSABLE) ×1 IMPLANT
TRAY TIBIAL OXFORD SZ D LF (Joint) ×3 IMPLANT
WRAPON POLAR PAD KNEE (MISCELLANEOUS) ×3

## 2020-06-17 NOTE — Discharge Instructions (Addendum)
Orthopedic discharge instructions: May shower with intact op-site dressing. Apply ice frequently to knee or use Polar Care. Take oxycodone as prescribed when needed.  May supplement with ES Tylenol if necessary. Start Eliquis 2.5 mg BID for 2 weeks starting Thursday morning, then take aspirin 325 mg daily for 4 weeks. This is for blood clot prevention. May weight-bear as tolerated so long on left leg - use walker for balance and support. Follow-up in 10-14 days or as scheduled.   AMBULATORY SURGERY  DISCHARGE INSTRUCTIONS   1) The drugs that you were given will stay in your system until tomorrow so for the next 24 hours you should not:  A) Drive an automobile B) Make any legal decisions C) Drink any alcoholic beverage   2) You may resume regular meals tomorrow.  Today it is better to start with liquids and gradually work up to solid foods.  You may eat anything you prefer, but it is better to start with liquids, then soup and crackers, and gradually work up to solid foods.   3) Please notify your doctor immediately if you have any unusual bleeding, trouble breathing, redness and pain at the surgery site, drainage, fever, or pain not relieved by medication.  4) Additional Instructions:   Partial Knee Replacement, Care After This sheet gives you information about how to care for yourself after your procedure. Your health care provider may also give you more specific instructions. If you have problems or questions, contact your health care provider. What can I expect after the procedure? After the procedure, it is common to have:  Pain. This can be controlled with pain medicine.  Soreness.  Stiffness.  Swelling.  Bruising. Follow these instructions at home: Medicines  Take over-the-counter and prescription medicines only as told by your health care provider.  If you were prescribed a blood thinner (anticoagulant), take it as told by your health care provider.  Ask  your health care provider if the medicine prescribed to you: ? Requires you to avoid driving or using heavy machinery. ? Can cause constipation. You may need to take these actions to prevent or treat constipation:  Drink enough fluid to keep your urine pale yellow.  Take over-the-counter or prescription medicines.  Eat foods that are high in fiber, such as beans, whole grains, and fresh fruits and vegetables.  Limit foods that are high in fat and processed sugars, such as fried or sweet foods. Bathing  Do not take baths, swim, or use a hot tub until your health care provider approves. Ask your health care provider if you may take showers.  Keep your bandage (dressing) dry until your health care provider says it can be removed. If the dressing is not waterproof, cover it with a waterproof covering when you take a shower. Incision care   Follow instructions from your health care provider about how to take care of your incision. Make sure you: ? Wash your hands with soap and water before and after you change your dressing. If soap and water are not available, use hand sanitizer. ? Change your dressing as told by your health care provider. ? Leave stitches (sutures), skin glue, or adhesive strips in place. These skin closures may need to stay in place for 2 weeks or longer. If adhesive strip edges start to loosen and curl up, you may trim the loose edges. Do not remove adhesive strips completely unless your health care provider tells you to do that.  Check your incision area every day  for signs of infection. Check for: ? More redness, swelling, or pain. ? More fluid or blood. ? Warmth. ? Pus or a bad smell. Managing pain, stiffness, and swelling      If directed, put ice on the affected area. ? Put ice in a plastic bag or use the icing device (cold flow pad or cryocuff) that you were given. Follow instructions from your health care provider about how to use the icing device. ? Place a  towel between your skin and the bag or between your skin and the icing device. ? Leave the ice on for 20 minutes, 2-3 times a day.  If directed, apply heat to the affected area before you exercise. Use the heat source that your health care provider recommends, such as a moist heat pack or a heating pad. ? Place a towel between your skin and the heat source. ? Leave the heat on for 20-30 minutes. ? Remove the heat if your skin turns bright red. This is especially important if you are unable to feel pain, heat, or cold. You may have a greater risk of getting burned.  Move your toes often to reduce stiffness and swelling.  Raise (elevate) your knee above the level of your heart while you are sitting or lying down. ? Use several pillows to keep your leg straight. ? Do not put a pillow just under the knee. If the knee is bent for a long time, this may lead to stiffness.  Wear elastic knee support as told by your health care provider. Activity   Use your walker, cane, or crutches to walk. You can put some weight on your knee. You should be able to walk without assistance after several days or a few weeks.  Do your knee exercises as instructed by your physical therapist.  Ask your health care provider when it is safe to drive.  Return to your normal activities as told by your health care provider. Ask your health care provider what activities are safe for you. Returning to all your usual activities may take about 6 weeks. General instructions  Wear compression stockings as told by your health care provider.  Tell your health care provider if you plan to have dental work. Also, tell your dentist about your joint replacement. ? Ask your health care provider if there are any special instructions you need to follow before having dental care and routine cleanings.  Do not use any products that contain nicotine or tobacco, such as cigarettes, e-cigarettes, and chewing tobacco. If you need help  quitting, ask your health care provider.  Keep all follow-up visits with your health care provider and your physical therapist. This is important. Physical therapy may continue for several months after surgery. Contact a health care provider if you:  Have chills or a fever.  Have pain that is not controlled by your pain medicine.  Are unable to put weight on your knee.  Have any signs of infection when you check your incision. Get help right away if you:  Develop a warm, tender, red, or swollen area in your leg.  Have chest pain or trouble breathing. Summary  After the procedure, it is common to have pain, soreness, and stiffness.  Follow instructions from your health care provider about how to take care of your incision. Check your incision area every day for signs of infection.  Use your walker, cane, or crutches to walk as told by your health care provider. You may have to do  this for the first few days or weeks. You can put some weight on your knee as told by your health care provider.  Return to your normal activities as told by your health care provider. Ask your health care provider what activities are safe for you. Returning to all your usual activities may take about 6 weeks.  Call your health care provider if your pain medicine is not helping or if you have any signs of infection. This information is not intended to replace advice given to you by your health care provider. Make sure you discuss any questions you have with your health care provider. Document Revised: 07/04/2018 Document Reviewed: 07/04/2018 Elsevier Patient Education  [place].  Bupivacaine Liposomal Suspension for Injection What is this medicine? BUPIVACAINE LIPOSOMAL (bue PIV a kane LIP oh som al) is an anesthetic. It causes loss of feeling in the skin or other tissues. It is used to prevent and to treat pain from some procedures. This medicine may be used for other purposes; ask your health  care provider or pharmacist if you have questions. COMMON BRAND NAME(S): EXPAREL What should I tell my health care provider before I take this medicine? They need to know if you have any of these conditions:  G6PD deficiency  heart disease  kidney disease  liver disease  low blood pressure  lung or breathing disease, like asthma  an unusual or allergic reaction to bupivacaine, other medicines, foods, dyes, or preservatives  pregnant or trying to get pregnant  breast-feeding How should I use this medicine? This medicine is for injection into the affected area. It is given by a health care professional in a hospital or clinic setting. Talk to your pediatrician regarding the use of this medicine in children. Special care may be needed. Overdosage: If you think you have taken too much of this medicine contact a poison control center or emergency room at once. NOTE: This medicine is only for you. Do not share this medicine with others. What if I miss a dose? This does not apply. What may interact with this medicine? This medicine may interact with the following medications:  acetaminophen  certain antibiotics like dapsone, nitrofurantoin, aminosalicylic acid, sulfonamides  certain medicines for seizures like phenobarbital, phenytoin, valproic acid  chloroquine  cyclophosphamide  flutamide  hydroxyurea  ifosfamide  metoclopramide  nitric oxide  nitroglycerin  nitroprusside  nitrous oxide  other local anesthetics like lidocaine, pramoxine, tetracaine  primaquine  quinine  rasburicase  sulfasalazine This list may not describe all possible interactions. Give your health care provider a list of all the medicines, herbs, non-prescription drugs, or dietary supplements you use. Also tell them if you smoke, drink alcohol, or use illegal drugs. Some items may interact with your medicine. What should I watch for while using this medicine? Your condition will be  monitored carefully while you are receiving this medicine. Be careful to avoid injury while the area is numb, and you are not aware of pain. What side effects may I notice from receiving this medicine? Side effects that you should report to your doctor or health care professional as soon as possible:  allergic reactions like skin rash, itching or hives, swelling of the face, lips, or tongue  seizures  signs and symptoms of a dangerous change in heartbeat or heart rhythm like chest pain; dizziness; fast, irregular heartbeat; palpitations; feeling faint or lightheaded; falls; breathing problems  signs and symptoms of methemoglobinemia such as pale, gray, or blue colored skin; headache; fast  heartbeat; shortness of breath; feeling faint or lightheaded, falls; tiredness Side effects that usually do not require medical attention (report to your doctor or health care professional if they continue or are bothersome):  anxious  back pain  changes in taste  changes in vision  constipation  dizziness  fever  nausea, vomiting This list may not describe all possible side effects. Call your doctor for medical advice about side effects. You may report side effects to FDA at 1-800-FDA-1088. Where should I keep my medicine? This drug is given in a hospital or clinic and will not be stored at home. NOTE: This sheet is a summary. It may not cover all possible information. If you have questions about this medicine, talk to your doctor, pharmacist, or health care provider.  2020 Elsevier/Gold Standard (2019-03-26 10:48:23)

## 2020-06-17 NOTE — Evaluation (Signed)
Physical Therapy Evaluation Patient Details Name: Hector Bryant MRN: [id number] DOB: October 12, 1945 Today's Date: 06/17/2020   History of Present Illness  Pt is a 74 yo male s/p L unicondylar knee arthroplasty, WBAT. PMH of tobacco use, bladder cancer with cystoprostatectomy and urostomy bag, vertigo.    Clinical Impression  Pt alert, reporting significant pain in L knee, has been medicated and RN aware of pain status prior to PT session, 8/10 with increased with mobility. He reported at baseline he is independent, drives, no falls. Lives with his wife who will be available 24/7.  PT reviewed information packet with pt and family at bedside, handout included information of exercises, condition, car transfers, stair transfers, and polar care education. He was able to perform a few supine exercises with light assist for pain. Supine <> sit with supervision or very light assist for LLE pain. Sit <> stand from EOB and RW and CGA and from recliner several times during the session,  Cued for hand placement with fair carryover. He ambulated around ~163ft total with RW and CGA; instructed in step to gait pattern with good carryover. He also navigated stairs with minA for RW steadying; cued for technique. Family verbalized understanding of technique.  Overall the patient demonstrated deficits (see "PT Problem List") that impede the patient's functional abilities, safety, and mobility and would benefit from skilled PT intervention. Recommendation is HHPT with supervision for mobility/OOB. RN informed pt from mobility and PT standpoint pt could be safe to discharge today, but is having uncontrolled pain and RN aware.      Follow Up Recommendations Home health PT;Supervision for mobility/OOB    Equipment Recommendations  Rolling walker with 5" wheels;3in1 (PT)    Recommendations for Other Services       Precautions / Restrictions Precautions Precautions: Fall Restrictions Weight Bearing Restrictions:  Yes LLE Weight Bearing: Weight bearing as tolerated      Mobility  Bed Mobility Overal bed mobility: Needs Assistance Bed Mobility: Supine to Sit;Sit to Supine     Supine to sit: Supervision;HOB elevated Sit to supine: Supervision;HOB elevated   General bed mobility comments: minA for LLE pain    Transfers Overall transfer level: Needs assistance Equipment used: Rolling walker (2 wheeled) Transfers: Sit to/from Stand Sit to Stand: Min guard;From elevated surface         General transfer comment: from recliner pt with bilateral UE support, more difficulty noted but able to do with CGA, and light steadying of RW  Ambulation/Gait Ambulation/Gait assistance: Min guard Gait Distance (Feet): 100 Feet Assistive device: Rolling walker (2 wheeled)       General Gait Details: step to gait pattern for pain management intermittently able to step through. Heavy weight bearing in UEs  Stairs Stairs: Yes Stairs assistance: Min guard;Min assist Stair Management: No rails;Backwards;With walker Number of Stairs: 2 General stair comments: cues, CGA. handout provided for safe home navigation  Wheelchair Mobility    Modified Rankin (Stroke Patients Only)       Balance Overall balance assessment: Needs assistance Sitting-balance support: Feet supported Sitting balance-Leahy Scale: Good       Standing balance-Leahy Scale: Fair Standing balance comment: reliant on UE support but able to empty urostomy bag in standing, CGA                             Pertinent Vitals/Pain Pain Assessment: 0-10 Pain Score: 8  Pain Location: L knee Pain Descriptors / Indicators: Aching;Guarding;Grimacing;Moaning;Crying  Pain Intervention(s): Limited activity within patient's tolerance;Monitored during session;Premedicated before session;Repositioned;Ice applied    Home Living Family/patient expects to be discharged to:: Private residence Living Arrangements: Spouse/significant  other Available Help at Discharge: Family;Available 24 hours/day Type of Home: House Home Access: Stairs to enter Entrance Stairs-Rails: None Entrance Stairs-Number of Steps: 2 Home Layout: One level Home Equipment: None      Prior Function Level of Independence: Independent               Hand Dominance   Dominant Hand: Right    Extremity/Trunk Assessment   Upper Extremity Assessment Upper Extremity Assessment: Overall WFL for tasks assessed    Lower Extremity Assessment Lower Extremity Assessment: Generalized weakness    Cervical / Trunk Assessment Cervical / Trunk Assessment: Normal  Communication   Communication: No difficulties  Cognition Arousal/Alertness: Awake/alert Behavior During Therapy: WFL for tasks assessed/performed Overall Cognitive Status: Within Functional Limits for tasks assessed                                        General Comments      Exercises Total Joint Exercises Ankle Circles/Pumps: AROM;Both;5 reps Quad Sets: AROM;Left;5 reps Heel Slides: AAROM;Strengthening;Left;10 reps Straight Leg Raises: AROM;Strengthening;Left;10 reps Goniometric ROM: -2 to 90 degree   Assessment/Plan    PT Assessment Patient needs continued PT services  PT Problem List Decreased strength;Decreased mobility;Decreased range of motion;Decreased activity tolerance;Decreased balance;Pain       PT Treatment Interventions DME instruction;Therapeutic exercise;Gait training;Balance training;Stair training;Functional mobility training;Therapeutic activities;Patient/family education;Neuromuscular re-education    PT Goals (Current goals can be found in the Care Plan section)  Acute Rehab PT Goals Patient Stated Goal: to decrease pain PT Goal Formulation: With patient Time For Goal Achievement: 07/01/20 Potential to Achieve Goals: Good    Frequency BID   Barriers to discharge        Co-evaluation               AM-PAC PT "6  Clicks" Mobility  Outcome Measure Help needed turning from your back to your side while in a flat bed without using bedrails?: None Help needed moving from lying on your back to sitting on the side of a flat bed without using bedrails?: None Help needed moving to and from a bed to a chair (including a wheelchair)?: None Help needed standing up from a chair using your arms (e.g., wheelchair or bedside chair)?: None Help needed to walk in hospital room?: A Little Help needed climbing 3-5 steps with a railing? : A Little 6 Click Score: 22    End of Session Equipment Utilized During Treatment: Gait belt Activity Tolerance: Patient tolerated treatment well;Patient limited by pain Patient left: in bed;with call bell/phone within reach;with bed alarm set;with family/visitor present Nurse Communication: Mobility status PT Visit Diagnosis: Other abnormalities of gait and mobility (R26.89);Muscle weakness (generalized) (M62.81);Difficulty in walking, not elsewhere classified (R26.2);Pain Pain - Right/Left: Left Pain - part of body: Knee    Time: 1415-1500 PT Time Calculation (min) (ACUTE ONLY): 45 min   Charges:   PT Evaluation $PT Eval Low Complexity: 1 Low PT Treatments $Therapeutic Exercise: 38-52 mins      Lieutenant Diego PT, DPT 4:23 PM,06/17/20
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
## 2020-06-17 NOTE — Plan of Care (Signed)
?  Problem: Clinical Measurements: ?Goal: Ability to maintain clinical measurements within normal limits will improve ?Outcome: Progressing ?Goal: Will remain free from infection ?Outcome: Progressing ?Goal: Diagnostic test results will improve ?Outcome: Progressing ?  ?
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
## 2020-06-17 NOTE — Progress Notes (Signed)
During the post-op period, pt c/o pain 8/10 in Left shoulder. Pt was given scheduled tylenol for chronic L shoulder pain. Pt stated he had relief in his shoulder pain but knee pain was now rated 8/10. Pt given ordered pain medication throughout post op period. Dr. Roland Rack contacted during post-op period regarding pain management plan and keeping pt overnight.

## 2020-06-17 NOTE — Op Note (Signed)
06/17/2020  10:04 AM  Patient:   Hector Bryant  Pre-Op Diagnosis:   Osteoarthritis of medial compartment, left knee.  Post-Op Diagnosis:   Same  Procedure:   Left unicondylar knee arthroplasty.  Surgeon:   Maryagnes Amos, MD  Assistant:   Laneta Simmers, RNFA  Anesthesia:   Spinal  Findings:   As above.  Complications:   None  EBL:   10 cc  Fluids:   1000 cc crystalloid  UOP:   None  TT:   75 minutes at 300 mmHg  Drains:   None  Closure:   Staples  Implants:   All-cemented Biomet Oxford system with a medium femoral component, a "D" sized tibial tray, and a 4 mm meniscal bearing insert.  Brief Clinical Note:   The patient is a 74 year old male with a history of progressively worsening medial sided left knee pain. His symptoms have progressed despite medications, activity modification, etc. His history and examination are consistent with degenerative joint disease, primarily involving the medial compartment. Preoperative MRI scanning also confirmed the presence of a posterior horn medial meniscus tear. The patient presents at this time for a left partial knee replacement.  Procedure:   The patient was brought into the operating room and a spinal placed by the anesthesiologist. The patient was repositioned so that the non-surgical leg was placed in a flexed and abducted position in the yellow fin leg holder while the surgical extremity was placed over the Biomet leg holder. The left lower extremity was prepped with ChloraPrep solution before being draped sterilely. Preoperative antibiotics were administered. After performing a timeout to verify the appropriate surgical site, the limb was exsanguinated with an Esmarch and the tourniquet inflated to 300 mmHg.   A standard anterior approach to the knee was made through an approximately 3.5-4 inch incision. The incision was carried down through the subcutaneous tissues to expose the superficial retinaculum. This was split the  length the incision and the medial flap elevated sufficiently to expose the medial retinaculum. The medial retinaculum was incised along the medial border of the patella tendon and extended proximally along the medial border of the patella, leaving a 3-4 mm cuff of tissue. The soft tissues were elevated off the anteromedial aspect of the proximal tibia. The anterior portion of the meniscus was removed after performing a subtotal excision of the infrapatellar fat pad. The anterior cruciate ligament was inspected and found to be in excellent condition. Osteophytes were removed from the inferior pole of the patella as well as from the notch using a quarter-inch osteotome. There were significant degenerative changes of both the femur and tibia on the medial side. The medial femoral condyle was sized using the large and medium sizers. It was felt that the medium guide best optimized the contour of the femur. This was left in place and the external tibial guide positioned. The coupling device was used to connect the guide to the medial femoral condylar sizer to optimize appropriate orientation. Two guide pins were inserted into the cutting block before the coupling device and sizer were removed. The appropriate tibial cut was made using the oscillating and reciprocating saws. The piece was removed in its entirety and taken to the back table where it was sized and found to be optimally replicated by a "D" sized component. The 9 mm spacer was inserted to verify that sufficient bone had been removed.  Attention was directed to femoral side. The intramedullary canal was accessed through a 4 mm drill hole.  The intramedullary guide was positioned before the guide for the femoral condylar holes was positioned. The appropriate coupling device connected this guide to the intramedullary guide before both drill holes were created in the distal aspect of the medial femoral condyle. The devices were removed and the posterior  condylar cutting block inserted. The appropriate cut was made using the reciprocating saw and this piece removed. The #0 spigot was inserted and the initial bone milling performed. A trial femoral component was inserted and both the flexion and extension gaps measured. In flexion, the gap measured 8 mm whereas in extension, it measured 5 mm. Therefore, the #3 spigot was selected and the secondary bone milling performed. Repeat sizing demonstrated symmetric flexion and extension gaps. The bone was removed from the postero-medial and postero-lateral aspects of the femoral condyle, as well as from the beneath the collar of the spigot. Bone also was removed from the anterior portion of the femur so as to minimize any potential impingement with the meniscal bearing insert. The trial components removed and several drill holes placed into the distal femoral condyle to further augment cement fixation.  Attention was redirected to the tibial side. The "D" sized tibial tray was positioned and temporarily secured using the appropriate spiked nail. The keel was created using the bi-bladed reciprocating saw and hoe. The keeled "D" sized trial tibial tray was inserted to be sure that it seated properly. At this point, a total of 20 cc of Exparel diluted out to 60 cc with normal saline and 30 cc of 0.5% Sensorcaine was injected in and around the posterior and medial capsular tissues, as well as the peri-incisional tissues to help with postoperative pain control.  The bony surfaces were prepared for cementing by irrigating them thoroughly with bacitracin saline solution using the jet lavage system before packing them with a dry Ray-Tec sponge. Meanwhile, cement was being mixed on the back table. When the cement was ready, the tibial tray was cemented in first. The excess cement was removed using a Surveyor, quantity after impacting it into place. Next, the femoral component was impacted into place. Again the excess cement was  removed using a Surveyor, quantity. The 4 mm spacer was inserted and the knee brought into near full extension while the cement hardened. Once the cement hardened, the spacer was removed and the 4 mm meniscal bearing insert was trialed. This demonstrated excellent tracking while the knee was placed through a range of motion, and showed no evidence towards subluxation or dislocation. In addition, it did not fit too tightly. Therefore, the permanent 4 mm meniscal bearing insert was snapped into position after verifying that no cement had been retained posteriorly. Again the knee was placed through a range of motion with the findings as described above.  The wound was copiously irrigated with sterile saline solution via the jet lavage system before the retinacular layer was reapproximated using #0 Vicryl interrupted sutures. At this point, 1 g of transexemic acid in 10 cc of normal saline was injected intra-articularly. The subcutaneous tissues were closed in two layers using 2-0 Vicryl interrupted sutures before the skin was closed using staples. A sterile occlusive dressing was applied to the knee before the patient was awakened. The patient was transferred back to his/her hospital bed and returned to the recovery room in satisfactory condition after tolerating the procedure well. A Polar Care device was applied to the knee as well.

## 2020-06-17 NOTE — Anesthesia Postprocedure Evaluation (Signed)
Anesthesia Post Note  Patient: Hector Bryant  Procedure(s) Performed: LEFT PARTIAL KNEE REPLACEMENT (Left Knee)  Patient location during evaluation: PACU Anesthesia Type: MAC and Spinal Level of consciousness: oriented, awake and alert and awake Pain management: pain level controlled Vital Signs Assessment: post-procedure vital signs reviewed and stable Respiratory status: spontaneous breathing, respiratory function stable and patient connected to nasal cannula oxygen Cardiovascular status: blood pressure returned to baseline and stable Postop Assessment: no headache, no backache and no apparent nausea or vomiting Anesthetic complications: no   No complications documented.   Last Vitals:  Vitals:   06/17/20 1024 06/17/20 1045  BP: 139/73 (!) 153/63  Pulse: 64 63  Resp: 14 16  Temp: (!) 36.4 C (!) 36.4 C  SpO2: 98% 96%    Last Pain:  Vitals:   06/17/20 1045  TempSrc:   PainSc: 7                  Phill Mutter

## 2020-06-17 NOTE — Transfer of Care (Signed)
Immediate Anesthesia Transfer of Care Note  Patient: Hector Bryant  Procedure(s) Performed: LEFT PARTIAL KNEE REPLACEMENT (Left Knee)  Patient Location: PACU  Anesthesia Type:Spinal  Level of Consciousness: drowsy and patient cooperative  Airway & Oxygen Therapy: Patient Spontanous Breathing  Post-op Assessment: Report given to RN and Post -op Vital signs reviewed and stable  Post vital signs: Reviewed and stable  Last Vitals:  Vitals Value Taken Time  BP 147/79 06/17/20 0953  Temp 36.3 C 06/17/20 0953  Pulse 88 06/17/20 0958  Resp 18 06/17/20 0958  SpO2 96 % 06/17/20 0958  Vitals shown include unvalidated device data.  Last Pain:  Vitals:   06/17/20 0953  TempSrc:   PainSc: Asleep         Complications: No complications documented.

## 2020-06-17 NOTE — Anesthesia Procedure Notes (Signed)
Spinal  Patient location during procedure: OR Start time: 06/17/2020 7:40 AM End time: 06/17/2020 7:52 AM Staffing Performed: resident/CRNA  Anesthesiologist: Phill Mutter, MD Resident/CRNA: Lowry Bowl, CRNA Preanesthetic Checklist Completed: patient identified, IV checked, site marked, risks and benefits discussed, surgical consent, monitors and equipment checked, pre-op evaluation and timeout performed Spinal Block Patient position: sitting Prep: DuraPrep Patient monitoring: heart rate, cardiac monitor, continuous pulse ox and blood pressure Approach: midline Location: L3-4 Injection technique: single-shot Needle Needle type: Quincke  Needle gauge: 22 G Needle length: 9 cm Assessment Sensory level: T4

## 2020-06-17 NOTE — Progress Notes (Signed)
Pt transferred to 137A. RN gave report to [place], Therapist, sports.

## 2020-06-18 ENCOUNTER — Encounter: Payer: Self-pay | Admitting: Surgery

## 2020-06-18 DIAGNOSIS — M1712 Unilateral primary osteoarthritis, left knee: Secondary | ICD-10-CM | POA: Diagnosis not present

## 2020-06-18 LAB — CBC
HCT: 33.4 % — ABNORMAL LOW (ref 39.0–52.0)
Hemoglobin: 11.6 g/dL — ABNORMAL LOW (ref 13.0–17.0)
MCH: 33 pg (ref 26.0–34.0)
MCHC: 34.7 g/dL (ref 30.0–36.0)
MCV: 95.2 fL (ref 80.0–100.0)
Platelets: 154 10*3/uL (ref 150–400)
RBC: 3.51 MIL/uL — ABNORMAL LOW (ref 4.22–5.81)
RDW: 13.9 % (ref 11.5–15.5)
WBC: 6.4 10*3/uL (ref 4.0–10.5)
nRBC: 0 % (ref 0.0–0.2)

## 2020-06-18 LAB — BASIC METABOLIC PANEL
Anion gap: 6 (ref 5–15)
BUN: 19 mg/dL (ref 8–23)
CO2: 26 mmol/L (ref 22–32)
Calcium: 9 mg/dL (ref 8.9–10.3)
Chloride: 109 mmol/L (ref 98–111)
Creatinine, Ser: 1.14 mg/dL (ref 0.61–1.24)
GFR, Estimated: >60 mL/min (ref >60)
Glucose, Bld: 109 mg/dL — ABNORMAL HIGH (ref 70–99)
Potassium: 3.8 mmol/L (ref 3.5–5.1)
Sodium: 141 mmol/L (ref 135–145)

## 2020-06-18 MED ORDER — APIXABAN 2.5 MG PO TABS: 2.5000 mg | ORAL_TABLET | Freq: Two times a day (BID) | ORAL | 0 refills | Status: DC

## 2020-06-18 MED ORDER — OXYCODONE HCL 5 MG PO TABS: 5.0000 mg | ORAL_TABLET | ORAL | 0 refills | Status: DC | PRN

## 2020-06-18 NOTE — Progress Notes (Signed)
This RN provided discharge instructions and teaching to the patient with the patient's wife at bedside. The patient demonstrated and verbalized understanding of the provided instructions. All outstanding questions were resolved. R FA PIV removed. Cannula intact. Pt tolerated well. All belongings packed and in tow. Transport to discharge patient via wheelchair to private vehicle.

## 2020-06-18 NOTE — TOC Initial Note (Signed)
Transition of Care [facility]) - Initial/Assessment Note    Patient Details  Name: Hector Bryant MRN: [id number] Date of Birth: March 11, 1946  Transition of [facility]) CM/SW Contact:    Shelbie Ammons, RN Phone Number: 06/18/2020, 9:37 AM  Clinical Narrative:    RNCM met with patient in room. Patient sitting up in chair working with PT but is agreeable to speak briefly with this CM. Patient reports to feeling ok today. He reports that he is already set up with [facility] for home health and that he got a walker and bedside commode yesterday that his wife took home. No other needs identified or verbalized at this time.                Expected Discharge Plan: [place] Barriers to Discharge: No Barriers Identified   Patient Goals and CMS Choice     Choice offered to / list presented to : Patient  Expected Discharge Plan and Services Expected Discharge Plan: [place] Acute Care Choice: [place] arrangements for the past 2 months: Single Family Home Expected Discharge Date: 06/18/20                         HH Arranged: PT,[place] Agency: [facility] (formerly [employer]) Date [place]: 06/18/20 Time [place]: [phone] Representative spoke with at [place]: [place] Arrangements/Services Living arrangements for the past 2 months: [place] Lives with:: Spouse Patient language and need for interpreter reviewed:: Yes Do you feel safe going back to the place where you live?: Yes        Care giver support system in place?: Yes (comment)   Criminal Activity/Legal Involvement Pertinent to Current Situation/Hospitalization: No - Comment as needed  Activities of Daily Living Home Assistive Devices/Equipment: Dentures (specify type),Eyeglasses ADL Screening (condition at time of admission) Patient's cognitive ability adequate to safely complete daily activities?: Yes Is the  patient deaf or have difficulty hearing?: No Does the patient have difficulty seeing, even when wearing glasses/contacts?: No Does the patient have difficulty concentrating, remembering, or making decisions?: No Patient able to express need for assistance with ADLs?: Yes Does the patient have difficulty dressing or bathing?: No Independently performs ADLs?: Yes (appropriate for developmental age) Does the patient have difficulty walking or climbing stairs?: No Weakness of Legs: None Weakness of Arms/Hands: None  Permission Sought/Granted                  Emotional Assessment Appearance:: Appears stated age   Affect (typically observed): Appropriate,Calm Orientation: : Oriented to Self,Oriented to Place,Oriented to  Time,Oriented to Situation Alcohol / Substance Use: Not Applicable Psych Involvement: No (comment)  Admission diagnosis:  Status post left partial knee replacement [Z96.652] Patient Active Problem List   Diagnosis Date Noted  . Status post left partial knee replacement 06/17/2020   PCP:  Sharyne Peach, MD Pharmacy:   [facility] [place], [place] [facility] [address] [facility] [place] [address] Phone: [phone] Fax: [phone]     Social Determinants of Health (SDOH) Interventions    Readmission Risk Interventions No flowsheet data found.

## 2020-06-18 NOTE — Progress Notes (Signed)
  Subjective: 1 Day Post-Op Procedure(s) (LRB): LEFT PARTIAL KNEE REPLACEMENT (Left) Patient reports pain as mild.   States that his pain is much improved this AM.  He is eager to go home. Patient is well, and has had no acute complaints or problems Plan is to go Home after hospital stay. Negative for chest pain and shortness of breath Fever: 99.3 last night, denies any SOB, chest pain or urinary symptoms. Gastrointestinal:Negative for nausea and vomiting  Objective: Vital signs in last 24 hours: Temp:  [97.3 F (36.3 C)-99.5 F (37.5 C)] 99.3 F (37.4 C) (12/23 0359) Pulse Rate:  [63-93] 73 (12/23 0359) Resp:  [12-21] 18 (12/23 0359) BP: (111-156)/(57-88) 111/57 (12/23 0359) SpO2:  [94 %-100 %] 94 % (12/23 0359)  Intake/Output from previous day:  Intake/Output Summary (Last 24 hours) at 06/18/2020 0821 Last data filed at 06/18/2020 0400 Gross per 24 hour  Intake 1300 ml  Output 1010 ml  Net 290 ml    Intake/Output this shift: No intake/output data recorded.  Labs: Recent Labs    06/18/20 0437  HGB 11.6*   Recent Labs    06/18/20 0437  WBC 6.4  RBC 3.51*  HCT 33.4*  PLT 154   Recent Labs    06/18/20 0437  NA 141  K 3.8  CL 109  CO2 26  BUN 19  CREATININE 1.14  GLUCOSE 109*  CALCIUM 9.0   No results for input(s): LABPT, INR in the last 72 hours.   EXAM General - Patient is Alert, Appropriate and Oriented Extremity - ABD soft Sensation intact distally Intact pulses distally Dorsiflexion/Plantar flexion intact Incision: scant drainage No cellulitis present Dressing/Incision - blood tinged drainage Motor Function - intact, moving foot and toes well on exam.  Negative homan's bilaterally. Abdomen soft with normal bowel sounds.  Past Medical History:  Diagnosis Date  . Cancer (HCC)    BLADDER  . Hyperlipidemia   . Vertigo   . Vitamin D insufficiency     Assessment/Plan: 1 Day Post-Op Procedure(s) (LRB): LEFT PARTIAL KNEE REPLACEMENT  (Left) Active Problems:   Status post left partial knee replacement  Estimated body mass index is 27.12 kg/m as calculated from the following:   Height as of this encounter: 5\' 6"  (1.676 m).   Weight as of this encounter: 76.2 kg. Advance diet Up with therapy D/C IV fluids when tolerating po intake.  Labs reviewed this AM. Patient is passing gas without pain. Pain is controlled. Work with PT this AM.  Plan for discharge after morning session of PT.  DVT Prophylaxis - Lovenox and Foot Pumps Weight-Bearing as tolerated to left leg  J. Cameron Proud, PA-C [facility] Orthopaedic Surgery 06/18/2020, 8:21 AM

## 2020-06-18 NOTE — Discharge Summary (Signed)
Physician Discharge Summary  Patient ID: Hector Bryant MRN: [id number] DOB/AGE: 08-30-1945 75 y.o.  Admit date: 06/17/2020 Discharge date: 06/18/2020  Admission Diagnoses:  Status post left partial knee replacement [Z96.652]  Discharge Diagnoses: Patient Active Problem List   Diagnosis Date Noted  . Status post left partial knee replacement 06/17/2020    Past Medical History:  Diagnosis Date  . Cancer (HCC)    BLADDER  . Hyperlipidemia   . Vertigo   . Vitamin D insufficiency      Transfusion: None.   Consultants (if any):   Discharged Condition: Improved  Hospital Course: Hector Bryant is an 74 y.o. 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    Surgeries: Procedure(s): LEFT PARTIAL KNEE REPLACEMENT on 06/17/2020 Patient tolerated the surgery well. Taken to PACU where she was stabilized and then transferred to the orthopedic floor.  Started on Eliquis 2.5mg  twice daily upon discharge for DVT prevention. Foot pumps applied bilaterally at 80 mm. Heels elevated on bed with rolled towels. No evidence of DVT. Negative Homan. Physical therapy started on day #1 for gait training and transfer. OT started day #1 for ADL and assisted devices.  Patient's IV was removed on POD1.  Implants: All-cemented [employer] [place] system with a medium femoral component, a "D" sized tibial tray, and a 4 mm meniscal bearing insert.  He was given perioperative antibiotics:  Anti-infectives (From admission, onward)   Start     Dose/Rate Route Frequency Ordered Stop   06/17/20 1400  ceFAZolin (ANCEF) IVPB 2g/100 mL premix        2 g 200 mL/hr over 30 Minutes Intravenous Every 6 hours 06/17/20 1101 06/18/20 0226   06/17/20 0615  ceFAZolin (ANCEF) IVPB 2g/100 mL premix        2 g 200 mL/hr over 30 Minutes Intravenous On call to O.R.  06/17/20 0602 06/17/20 0756   06/17/20 0612  ceFAZolin (ANCEF) 2-4 GM/100ML-% IVPB       Note to Pharmacy: Myles Lipps   : cabinet override      06/17/20 0612 06/17/20 0804    .  He was given sequential compression devices and early ambulation for DVT prophylaxis.  He benefited maximally from the hospital stay and there were no complications.    Recent vital signs:  Vitals:   06/18/20 0006 06/18/20 0359  BP: (!) 120/59 (!) 111/57  Pulse: 76 73  Resp: 16 18  Temp: 98.1 F (36.7 C) 99.3 F (37.4 C)  SpO2: 97% 94%    Recent laboratory studies:  Lab Results  Component Value Date   HGB 11.6 (L) 06/18/2020   HGB 14.0 06/09/2020   HGB 12.8 (L) 09/13/2019   Lab Results  Component Value Date   WBC 6.4 06/18/2020   PLT 154 06/18/2020   No results found for: INR Lab Results  Component Value Date   NA 141 06/18/2020   K 3.8 06/18/2020   CL 109 06/18/2020   CO2 26 06/18/2020   BUN 19 06/18/2020   CREATININE 1.14 06/18/2020   GLUCOSE 109 (H) 06/18/2020    Discharge Medications:   Allergies as of 06/18/2020   No Known Allergies     Medication List    STOP taking these medications   meloxicam 7.5 MG tablet Commonly known as: MOBIC   traMADol 50 MG tablet Commonly known as: Veatrice Bourbon  TAKE these medications   apixaban 2.5 MG Tabs tablet Commonly known as: Eliquis Take 1 tablet (2.5 mg total) by mouth 2 (two) times daily.   atorvastatin 20 MG tablet Commonly known as: LIPITOR Take 20 mg by mouth at bedtime.   oxyCODONE 5 MG immediate release tablet Commonly known as: Roxicodone Take 1-2 tablets (5-10 mg total) by mouth every 4 (four) hours as needed for moderate pain or severe pain.   vitamin B-12 500 MCG tablet Commonly known as: CYANOCOBALAMIN Take 500 mcg by mouth at bedtime.      Diagnostic Studies: DG Knee Left Port  Result Date: 06/17/2020 CLINICAL DATA:  Status post left partial knee replacement EXAM: PORTABLE LEFT KNEE - 1-2 VIEW  COMPARISON:  None. FINDINGS: Partial knee replacement is noted medially. No acute bony or soft tissue abnormality is noted. Patellofemoral degenerative changes are seen. IMPRESSION: Postop change as described. Electronically Signed   By: Inez Catalina M.D.   On: 06/17/2020 10:15   Disposition: Patients pain is much improved this AM.  He is eager to go home.  Discharge home today with HHPT.   Follow-up Information    Reche Dixon, PA-C Follow up on 07/01/2020.   Specialty: Orthopedic Surgery Why: at 1:45pm Contact information: [address] [place] [address] [phone]              Signed: Judson Roch PA-C 06/18/2020, 8:28 AM

## 2020-06-18 NOTE — Plan of Care (Signed)
  Problem: Clinical Measurements: Goal: Ability to maintain clinical measurements within normal limits will improve 06/18/2020 1011 by Cristela Blue, RN Outcome: Progressing 06/18/2020 1011 by Cristela Blue, RN Outcome: Progressing Goal: Will remain free from infection 06/18/2020 1011 by Cristela Blue, RN Outcome: Progressing 06/18/2020 1011 by Cristela Blue, RN Outcome: Progressing Goal: Diagnostic test results will improve 06/18/2020 1011 by Cristela Blue, RN Outcome: Progressing 06/18/2020 1011 by Cristela Blue, RN Outcome: Progressing   Problem: Activity: Goal: Risk for activity intolerance will decrease 06/18/2020 1011 by Cristela Blue, RN Outcome: Progressing 06/18/2020 1011 by Cristela Blue, RN Outcome: Progressing   Problem: Pain Managment: Goal: General experience of comfort will improve 06/18/2020 1011 by Cristela Blue, RN Outcome: Progressing 06/18/2020 1011 by Cristela Blue, RN Outcome: Progressing   Problem: Safety: Goal: Ability to remain free from injury will improve 06/18/2020 1011 by Cristela Blue, RN Outcome: Progressing 06/18/2020 1011 by Cristela Blue, RN Outcome: Progressing

## 2020-06-18 NOTE — Progress Notes (Signed)
Physical Therapy Treatment Patient Details Name: Hector Bryant MRN: [id number] DOB: [birth year] Today's Date: 06/18/2020    History of Present Illness 74 yo male s/p L unicondylar knee arthroplasty, WBAT. PMH of tobacco use, bladder cancer with cystoprostatectomy and urostomy bag, vertigo.    PT Comments    Pt did very well with PT session, showing ability to easily circumambulate the nurses' station, negotiate steps w/o physical assist (did need re-education for the proper sequencing).  He has nearly 100 degrees of flexion, showed good quad strength and control and generally speaking is at or beyond typical POD1 expectations, safe to d/c home per ortho.    Follow Up Recommendations  Home health PT;Supervision for mobility/OOB     Equipment Recommendations       Recommendations for Other Services       Precautions / Restrictions Precautions Precautions: Fall Restrictions Weight Bearing Restrictions: Yes LLE Weight Bearing: Weight bearing as tolerated    Mobility  Bed Mobility Overal bed mobility: Independent Bed Mobility: Supine to Sit     Supine to sit: Modified independent (Device/Increase time)     General bed mobility comments: Pt was able to get himself up to sitting EOB w/o issue  Transfers Overall transfer level: Independent Equipment used: Rolling walker (2 wheeled) Transfers: Sit to/from Stand Sit to Stand: Min guard         General transfer comment: from recliner pt with bilateral UE support, more difficulty noted but able to do with CGA, and light steadying of RW  Ambulation/Gait Ambulation/Gait assistance: Min guard Gait Distance (Feet): 250 Feet Assistive device: Rolling walker (2 wheeled)       General Gait Details: Pt with much more confident and improved gait this date.  Had some initial hesitancy but quickly was able to assume more consistent cadence with appropriate speed/walker momentum and no LOBs.  Improved mechanics with  cuing.   Stairs Stairs: Yes Stairs assistance: Min guard Stair Management: No rails;Backwards;With walker Number of Stairs: 4 General stair comments: Pt still did require a few cues today to insure appropriate sequencing.  Despite explaination he did try to descend with surgical knee and did not have the control to do so safely - did well when cued to use appropriate sequencing   Wheelchair Mobility    Modified Rankin (Stroke Patients Only)       Balance Overall balance assessment: Needs assistance Sitting-balance support: Feet supported Sitting balance-Leahy Scale: Good Sitting balance - Comments: no issues     Standing balance-Leahy Scale: Good Standing balance comment: good confidence with static standing even w/o AD, reliant on walker during weight shift/dynamic tasks                            Cognition Arousal/Alertness: Awake/alert Behavior During Therapy: WFL for tasks assessed/performed Overall Cognitive Status: Within Functional Limits for tasks assessed                                        Exercises Total Joint Exercises Ankle Circles/Pumps: Strengthening;10 reps Quad Sets: Strengthening;10 reps Heel Slides: Strengthening;10 reps Hip ABduction/ADduction: Strengthening;10 reps Straight Leg Raises: AROM;10 reps Long Arc Quad: Strengthening;10 reps Knee Flexion: PROM;5 reps Goniometric ROM: 1-98    General Comments        Pertinent Vitals/Pain Pain Assessment: 0-10 Pain Score: 4     Home Living  Prior Function            PT Goals (current goals can now be found in the care plan section) Progress towards PT goals: Progressing toward goals    Frequency    BID      PT Plan Current plan remains appropriate    Co-evaluation              AM-PAC PT "6 Clicks" Mobility   Outcome Measure  Help needed turning from your back to your side while in a flat bed without using  bedrails?: None Help needed moving from lying on your back to sitting on the side of a flat bed without using bedrails?: None Help needed moving to and from a bed to a chair (including a wheelchair)?: None Help needed standing up from a chair using your arms (e.g., wheelchair or bedside chair)?: None Help needed to walk in hospital room?: A Little Help needed climbing 3-5 steps with a railing? : A Little 6 Click Score: 22    End of Session Equipment Utilized During Treatment: Gait belt Activity Tolerance: Patient tolerated treatment well;Patient limited by pain Patient left: with call bell/phone within reach;with chair alarm set Nurse Communication: Mobility status PT Visit Diagnosis: Other abnormalities of gait and mobility (R26.89);Muscle weakness (generalized) (M62.81);Difficulty in walking, not elsewhere classified (R26.2);Pain Pain - Right/Left: Left Pain - part of body: Knee     Time: 0840-0920 PT Time Calculation (min) (ACUTE ONLY): 40 min  Charges:  $Gait Training: 8-22 mins $Therapeutic Exercise: 8-22 mins $Therapeutic Activity: 8-22 mins                     Kreg Shropshire, DPT 06/18/2020, 11:13 AM

## 2021-06-14 ENCOUNTER — Other Ambulatory Visit: Payer: Self-pay | Admitting: Student

## 2021-06-14 DIAGNOSIS — M1712 Unilateral primary osteoarthritis, left knee: Secondary | ICD-10-CM

## 2021-06-14 DIAGNOSIS — M25062 Hemarthrosis, left knee: Secondary | ICD-10-CM

## 2021-06-14 DIAGNOSIS — Z96652 Presence of left artificial knee joint: Secondary | ICD-10-CM

## 2021-06-23 ENCOUNTER — Ambulatory Visit
Admission: RE | Admit: 2021-06-23 | Discharge: 2021-06-23 | Disposition: A | Discharge status: Home / Self Care | Payer: Medicare Other | Source: Ambulatory Visit | Attending: Student | Admitting: Student

## 2021-06-23 ENCOUNTER — Other Ambulatory Visit: Payer: Self-pay

## 2021-06-23 DIAGNOSIS — Z96652 Presence of left artificial knee joint: Secondary | ICD-10-CM

## 2021-06-23 DIAGNOSIS — M1712 Unilateral primary osteoarthritis, left knee: Secondary | ICD-10-CM | POA: Diagnosis present

## 2021-06-23 DIAGNOSIS — M25062 Hemarthrosis, left knee: Secondary | ICD-10-CM

## 2021-06-25 ENCOUNTER — Other Ambulatory Visit
Admission: RE | Admit: 2021-06-25 | Discharge: 2021-06-25 | Disposition: A | Discharge status: Home / Self Care | Payer: Medicare Other | Source: Ambulatory Visit | Attending: Sports Medicine | Admitting: Sports Medicine

## 2021-06-25 DIAGNOSIS — M25462 Effusion, left knee: Secondary | ICD-10-CM | POA: Diagnosis present

## 2021-06-25 DIAGNOSIS — M25562 Pain in left knee: Secondary | ICD-10-CM | POA: Insufficient documentation

## 2021-06-25 DIAGNOSIS — M25062 Hemarthrosis, left knee: Secondary | ICD-10-CM | POA: Insufficient documentation

## 2021-06-25 LAB — SYNOVIAL CELL COUNT + DIFF, W/ CRYSTALS
Crystals, Fluid: NONE SEEN
Eosinophils-Synovial: 8 %
Lymphocytes-Synovial Fld: 29 %
Monocyte-Macrophage-Synovial Fluid: 3 %
Neutrophil, Synovial: 60 %
Other Cells-SYN: 0
WBC, Synovial: 1369 /mm3 — ABNORMAL HIGH (ref 0–200)

## 2021-09-11 ENCOUNTER — Emergency Department: Payer: Medicare Other

## 2021-09-11 ENCOUNTER — Emergency Department
Admission: EM | Admit: 2021-09-11 | Discharge: 2021-09-11 | Disposition: A | Discharge status: Home / Self Care | Payer: Medicare Other | Attending: Emergency Medicine | Admitting: Emergency Medicine

## 2021-09-11 ENCOUNTER — Other Ambulatory Visit: Payer: Self-pay

## 2021-09-11 DIAGNOSIS — Z96652 Presence of left artificial knee joint: Secondary | ICD-10-CM | POA: Diagnosis not present

## 2021-09-11 DIAGNOSIS — Z8551 Personal history of malignant neoplasm of bladder: Secondary | ICD-10-CM | POA: Insufficient documentation

## 2021-09-11 DIAGNOSIS — M25462 Effusion, left knee: Secondary | ICD-10-CM | POA: Diagnosis not present

## 2021-09-11 DIAGNOSIS — M25562 Pain in left knee: Secondary | ICD-10-CM | POA: Diagnosis present

## 2021-09-11 LAB — CBC WITH DIFFERENTIAL/PLATELET
Abs Immature Granulocytes: 0.03 10*3/uL (ref 0.00–0.07)
Basophils Absolute: 0.1 10*3/uL (ref 0.0–0.1)
Basophils Relative: 1 %
Eosinophils Absolute: 0.3 10*3/uL (ref 0.0–0.5)
Eosinophils Relative: 4 %
HCT: 38.4 % — ABNORMAL LOW (ref 39.0–52.0)
Hemoglobin: 12.9 g/dL — ABNORMAL LOW (ref 13.0–17.0)
Immature Granulocytes: 1 %
Lymphocytes Relative: 23 %
Lymphs Abs: 1.4 10*3/uL (ref 0.7–4.0)
MCH: 31.2 pg (ref 26.0–34.0)
MCHC: 33.6 g/dL (ref 30.0–36.0)
MCV: 92.8 fL (ref 80.0–100.0)
Monocytes Absolute: 0.6 10*3/uL (ref 0.1–1.0)
Monocytes Relative: 9 %
Neutro Abs: 3.8 10*3/uL (ref 1.7–7.7)
Neutrophils Relative %: 62 %
Platelets: 218 10*3/uL (ref 150–400)
RBC: 4.14 MIL/uL — ABNORMAL LOW (ref 4.22–5.81)
RDW: 13.2 % (ref 11.5–15.5)
WBC: 6.2 10*3/uL (ref 4.0–10.5)
nRBC: 0 % (ref 0.0–0.2)

## 2021-09-11 LAB — BASIC METABOLIC PANEL
Anion gap: 8 (ref 5–15)
BUN: 20 mg/dL (ref 8–23)
CO2: 27 mmol/L (ref 22–32)
Calcium: 9.8 mg/dL (ref 8.9–10.3)
Chloride: 107 mmol/L (ref 98–111)
Creatinine, Ser: 1.21 mg/dL (ref 0.61–1.24)
GFR, Estimated: >60 mL/min (ref >60)
Glucose, Bld: 100 mg/dL — ABNORMAL HIGH (ref 70–99)
Potassium: 4.4 mmol/L (ref 3.5–5.1)
Sodium: 142 mmol/L (ref 135–145)

## 2021-09-11 LAB — URIC ACID: Uric Acid, Serum: 7.7 mg/dL (ref 3.7–8.6)

## 2021-09-11 LAB — SEDIMENTATION RATE: Sed Rate: 9 mm/hr (ref 0–20)

## 2021-09-11 MED ORDER — LIDOCAINE HCL (PF) 1 % IJ SOLN
10.0000 mL | Freq: Once | INTRAMUSCULAR | Status: AC
  Administered 2021-09-11: 10 mL
  Filled 2021-09-11: qty 10

## 2021-09-11 MED ORDER — HYDROCODONE-ACETAMINOPHEN 5-325 MG PO TABS
2.0000 | ORAL_TABLET | Freq: Once | ORAL | Status: AC
  Administered 2021-09-11: 2 via ORAL
  Filled 2021-09-11: qty 2

## 2021-09-11 NOTE — ED Triage Notes (Signed)
Patient to ER via POV with complaints of left knee swelling. Reports he has been having issues with his knee since surgery last year. States that he was laying on the couch today when his knee suddenly became swollen and extremely painful. Knee very swollen on assessment.  ? ?Reports having bursitis several months ago and having 125cc drained.  ?

## 2021-09-11 NOTE — Discharge Instructions (Addendum)
-  Follow-up with orthopedics, as discussed. ?-Continue to take meloxicam and Tylenol as needed.  Wear your knee brace/sleeve as needed. ?-Return to the emergency department anytime if you begin to experience any new or worsening symptoms. ?

## 2021-09-11 NOTE — ED Provider Notes (Signed)
? ?[facility] ?Provider Note ? ? ? Event Date/Time  ? First MD Initiated Contact with Patient 09/11/21 1804   ?  (approximate) ? ? ?History  ? ?Chief Complaint ?Knee Pain ? ? ?HPI ?Hector Bryant is a 76 y.o. male, history of hyperlipidemia, osteoarthritis, left partial knee replacement, bladder cancer, presents to the emergency department for evaluation of knee pain.  Patient states that his knee began swelling suddenly last night.  He states that he was ambulatory yesterday, however today he is not not able to walk.  He states that he has a history of hemarthrosis, and recently had to have 125 cc of blood drained from his knee approximately 2 months ago.  Denies any recent injuries or illnesses.  Denies fever/chills, numbness/tingling in lower extremities, headache, sinus congestion, cough, chest pain, shortness of breath, abdominal pain, nausea/vomiting, or diarrhea. ? ?Per records review, patient was recently evaluated by orthopedics on 09/07/2021.  Patient had full range of motion in the affected extremity at the time, though suggested possible total knee replacement if patient continues to have persistent effusions. ? ? ?History Limitations: No limitations. ? ?  ? ? ?Physical Exam  ?Triage Vital Signs: ?ED Triage Vitals  ?Enc Vitals Group  ?   BP 09/11/21 1803 (!) 179/85  ?   Pulse Rate 09/11/21 1803 90  ?   Resp 09/11/21 1803 19  ?   Temp 09/11/21 1803 98.1 ?F (36.7 ?C)  ?   Temp Source 09/11/21 1803 Oral  ?   SpO2 09/11/21 1803 97 %  ?   Weight --   ?   Height 09/11/21 1801 '5\' 6"'$  (1.676 m)  ?   Head Circumference --   ?   Peak Flow --   ?   Pain Score 09/11/21 1801 10  ?   Pain Loc --   ?   Pain Edu? --   ?   Excl. in [place]? --   ? ? ?Most recent vital signs: ?Vitals:  ? 09/11/21 1803 09/11/21 1803  ?BP: (!) 179/85 (!) 179/85  ?Pulse: 90 92  ?Resp: 19 17  ?Temp: 98.1 ?F (36.7 ?C) 98.1 ?F (36.7 ?C)  ?SpO2: 97% 97%  ? ? ?General: Awake, NAD.  ?Skin: Warm, dry.  ?CV: Good peripheral  perfusion.  ?Resp: Normal effort.  ?Abd: Soft, non-tender. No distention.  ?Neuro: At baseline. No gross neurological deficits.  ?Other: Left knee diffusely swollen.  No remarkable erythema or warmth.  Tenderness diffusely, particular around the suprapatellar region.  Patient unable to move beyond 30 degree flexion. ? ?Physical Exam ? ? ? ?ED Results / Procedures / Treatments  ?Labs ?(all labs ordered are listed, but only abnormal results are displayed) ?Labs Reviewed  ?CBC WITH DIFFERENTIAL/PLATELET - Abnormal; Notable for the following components:  ?    Result Value  ? RBC 4.14 (*)   ? Hemoglobin 12.9 (*)   ? HCT 38.4 (*)   ? All other components within normal limits  ?BASIC METABOLIC PANEL - Abnormal; Notable for the following components:  ? Glucose, Bld 100 (*)   ? All other components within normal limits  ?BODY FLUID CULTURE W GRAM STAIN  ?SEDIMENTATION RATE  ?URIC ACID  ? ? ? ?EKG ?Not applicable. ? ? ?RADIOLOGY ? ?ED Provider Interpretation: I personally reviewed and interpreted this x-ray.  Moderate knee joint effusion present. ? ?DG Knee Complete 4 Views Left ? ?Result Date: 09/11/2021 ?CLINICAL DATA:  Swelling.  Unable to straighten knee. EXAM: LEFT  KNEE - COMPLETE 4+ VIEW COMPARISON:  CT 06/23/2021 FINDINGS: Medial compartment hemiarthroplasty. Allowing for difficulty with positioning, hardware alignment is maintained. There is mild patellofemoral osteoarthritis with spurring. Moderate knee joint effusion. Soft tissue edema anteriorly. No fracture or erosion or bone destruction. IMPRESSION: 1. Medial compartment hemiarthroplasty without gross complication, evaluation is limited due to difficulties with positioning. 2. Moderate knee joint effusion.  Generalized soft tissue edema. Electronically Signed   By: Keith Rake M.D.   On: 09/11/2021 19:04   ? ?PROCEDURES: ? ?Critical Care performed: None. ? ?.Joint Aspiration/Arthrocentesis ? ?Date/Time: 09/11/2021 8:14 PM ?Performed by: Teodoro Spray,  PA ?Authorized by: Teodoro Spray, PA  ? ?Consent:  ?  Consent obtained:  Verbal ?  Consent given by:  Patient ?  Risks discussed:  Bleeding, infection, pain and incomplete drainage ?Universal protocol:  ?  Patient identity confirmed:  Verbally with patient ?Location:  ?  Location:  Knee ?  Knee:  L knee ?Anesthesia:  ?  Anesthesia method:  Local infiltration ?  Local anesthetic:  Lidocaine 1% w/o epi ?Procedure details:  ?  Preparation: Patient was prepped and draped in usual sterile fashion   ?  Needle gauge:  18 G ?  Ultrasound guidance: yes   ?  Approach:  Superior ?  Aspirate amount:  95 cc ?  Aspirate characteristics:  Bloody ?  Steroid injected: no   ?  Specimen collected: yes   ?Post-procedure details:  ?  Dressing:  Adhesive bandage ?  Procedure completion:  Tolerated well, no immediate complications ? ? ? ?MEDICATIONS ORDERED IN ED: ?Medications  ?HYDROcodone-acetaminophen (NORCO/VICODIN) 5-325 MG per tablet 2 tablet (2 tablets Oral Given 09/11/21 1923)  ?lidocaine (PF) (XYLOCAINE) 1 % injection 10 mL (10 mLs Infiltration Given by Other 09/11/21 1945)  ? ? ? ?IMPRESSION / MDM / ASSESSMENT AND PLAN / ED COURSE  ?I reviewed the triage vital signs and the nursing notes. ?             ?               ? ?Differential diagnosis includes, but is not limited to, joint effusion, hemarthrosis, septic arthritis, gout, ACL/PCL injury. ? ?ED Course ?Patient appears uncomfortable, but stable.  Vital signs within normal limits.  Patient worsening significant pain.  We will go ahead treat with hydrocodone/acetaminophen. ? ?Performed arthrocentesis of the knee joint, drained approximately 95 cc of blood.  Patient experienced immediate improvement in symptoms.  On reexamination, he is able to perform full flexion and extension of his knee.  Negative anterior drawer.  No pain with valgus/varus maneuvering.  Patient is able to ambulate again. ? ?CBC shows no evidence of leukocytosis or anemia.  BMP unremarkable.   Segmentation rate unremarkable at 9.  Unlikely septic arthritis. ? ?Uric acid unremarkable at 7.7.  Unlikely gout. ? ?Assessment/Plan ?Presentation consistent with hemarthrosis.  Successfully drained 95 cc of blood here in the emergency department.  No evidence of infection or gout so far on blood work.  Currently pending synovial fluid culture.  Plan is to discharge with orthopedic follow-up if fluid culture negative for evidence of infection.  Transferred this patient's care over to [facility], PA-C. ? ? ?  ? ? ?FINAL CLINICAL IMPRESSION(S) / ED DIAGNOSES  ? ?Final diagnoses:  ?Effusion of left knee  ? ? ? ?Rx / DC Orders  ? ?ED Discharge Orders   ? ? None  ? ?  ? ? ? ?Note:  This document  was prepared using Systems analyst and may include unintentional dictation errors. ?  ?Teodoro Spray, [place] ?09/11/21 2019 ? ?  ?Rada Hay, MD ?09/12/21 1724 ? ?

## 2021-09-12 NOTE — ED Provider Notes (Signed)
----------------------------------------- ?  12:01 AM on 09/12/2021 ?----------------------------------------- ? ?Blood pressure (!) 166/81, pulse 83, temperature 98.1 ?F (36.7 ?C), resp. rate 16, height '5\' 6"'$  (1.676 m), SpO2 99 %. ? ?Assuming care from Mardee Postin, PA-C/NP-C.  In short, DEMONTEZ NOVACK is a 76 y.o. male with a chief complaint of Knee Pain ?Marland Kitchen  Refer to the original H&P for additional details. ? ?The current plan of care is to await body fluid Gram stain results. ? ?____________________________________________ ? ?PROCEDURES ? ?Procedures ?____________________________________________ ? ?INITIAL IMPRESSION / ASSESSMENT AND PLAN / ED COURSE ? ?Patient to the ED for evaluation of acute left knee pain and effusion.  Patient agreed to a knee arthrocentesis which was performed here in the ED.  There was an apparent delay and transfer for in the sample to the lab, and the patient at this time is not interested in remaining in the ED while awaiting results.  I reviewed the patient's chart, and this is at least the third time that he had the same knee tapped in the last 2 months.  Previous fluid analysis did not reveal any excessive white blood cell count, crystals or abnormal culture growth.  As such, the patient is inclined to follow-up with his Ortho provider in the ED next week.  As a courtesy I sent a message via secure chat to Dr. Roland Rack who happens to be on-call, about the patient and he is agreeable to see the patient in the office next week.  At the time of this note entry, patient's fluid sample is still processing. ? ?XAIVER ROSKELLEY was evaluated in Emergency Department on 09/12/2021 for the symptoms described in the history of present illness. He was evaluated in the context of the global COVID-19 pandemic, which necessitated consideration that the patient might be at risk for infection with the SARS-CoV-2 virus that causes COVID-19. Institutional protocols and algorithms that pertain to the  evaluation of patients at risk for COVID-19 are in a state of rapid change based on information released by regulatory bodies including the CDC and federal and state organizations. These policies and algorithms were followed during the patient's care in the ED. ?_________________________________________ ? ?FINAL CLINICAL IMPRESSION(S) / ED DIAGNOSES ? ?Final diagnoses:  ?Effusion of left knee  ?Acute pain of left knee  ? ? ?  ?Melvenia Needles, PA-C ?09/12/21 0022 ? ?  ?Carrie Mew, MD ?09/14/21 1016 ? ?

## 2021-09-15 LAB — BODY FLUID CULTURE W GRAM STAIN
Culture: NO GROWTH
Gram Stain: NONE SEEN
Special Requests: NORMAL

## 2021-09-20 ENCOUNTER — Other Ambulatory Visit: Payer: Self-pay | Admitting: Surgery

## 2021-09-24 ENCOUNTER — Other Ambulatory Visit: Payer: Self-pay

## 2021-09-24 ENCOUNTER — Encounter
Admission: RE | Admit: 2021-09-24 | Discharge: 2021-09-24 | Disposition: A | Discharge status: Home / Self Care | Payer: Medicare Other | Source: Ambulatory Visit | Attending: Surgery | Admitting: Surgery

## 2021-09-24 HISTORY — DX: Essential (primary) hypertension: I10

## 2021-09-24 HISTORY — DX: Gastro-esophageal reflux disease without esophagitis: K21.9

## 2021-09-24 HISTORY — DX: Other specified postprocedural states: Z98.890

## 2021-09-24 NOTE — Patient Instructions (Addendum)
Your procedure is scheduled on:09-29-21 Wednesday ?Report to the Registration Desk on the 1st floor of the [facility].Then proceed to the 2nd floor Surgery Desk in the [facility] ?To find out your arrival time, please call [phone] between 1PM - 3PM on:09-28-21 Tuesday ? ?REMEMBER: ?Instructions that are not followed completely may result in serious medical risk, up to and including death; or upon the discretion of your surgeon and anesthesiologist your surgery may need to be rescheduled. ? ?Do not eat food after midnight the night before surgery.  ?No gum chewing, lozengers or hard candies. ? ?You may however, drink CLEAR liquids up to 2 hours before you are scheduled to arrive for your surgery. Do not drink anything within 2 hours of your scheduled arrival time. ? ?Clear liquids include: ?- water  ?- apple juice without pulp ?- gatorade (not RED colors) ?- black coffee or tea (Do NOT add milk or creamers to the coffee or tea) ?Do NOT drink anything that is not on this list. ? ?In addition, your doctor has ordered for you to drink the provided  ?Ensure Pre-Surgery Clear Carbohydrate Drink  ?Drinking this carbohydrate drink up to two hours before surgery helps to reduce insulin resistance and improve patient outcomes. Please complete drinking 2 hours prior to scheduled arrival time. ? ?TAKE THESE MEDICATIONS THE MORNING OF SURGERY WITH A SIP OF WATER: ?-omeprazole (PRILOSEC OTC)-take one the night before and one on the morning of surgery - helps to prevent nausea after surgery.) ? ?One week prior to surgery: ?Stop Anti-inflammatories (NSAIDS) such asmeloxicam (MOBIC) , Advil, Aleve, Ibuprofen, Motrin, Naproxen, Naprosyn and Aspirin based products such as Excedrin, Goodys Powder, BC Powder.You may however, continue to take Tylenol if needed for pain up until the day of surgery. ? ?Stop ANY OVER THE COUNTER supplements/vitamins NOW (09-24-21) until after surgery. ? ?No Alcohol for 24 hours before or after  surgery. ? ?No Smoking including e-cigarettes for 24 hours prior to surgery.  ?No chewable tobacco products for at least 6 hours prior to surgery.  ?No nicotine patches on the day of surgery. ? ?Do not use any "recreational" drugs for at least a week prior to your surgery.  ?Please be advised that the combination of cocaine and anesthesia may have negative outcomes, up to and including death. ?If you test positive for cocaine, your surgery will be cancelled. ? ?On the morning of surgery brush your teeth with toothpaste and water, you may rinse your mouth with mouthwash if you wish. ?Do not swallow any toothpaste or mouthwash. ? ?Use CHG Soap as directed on instruction sheet. ? ?Do not wear jewelry, make-up, hairpins, clips or nail polish. ? ?Do not wear lotions, powders, or perfumes.  ? ?Do not shave body from the neck down 48 hours prior to surgery just in case you cut yourself which could leave a site for infection.  ?Also, freshly shaved skin may become irritated if using the CHG soap. ? ?Contact lenses, hearing aids and dentures may not be worn into surgery. ? ?Do not bring valuables to the hospital. [facility] is not responsible for any missing/lost belongings or valuables.  ? ?Notify your doctor if there is any change in your medical condition (cold, fever, infection). ? ?Wear comfortable clothing (specific to your surgery type) to the hospital. ? ?After surgery, you can help prevent lung complications by doing breathing exercises.  ?Take deep breaths and cough every 1-2 hours. Your doctor may order a device called an Chiropodist to  help you take deep breaths. ?When coughing or sneezing, hold a pillow firmly against your incision with both hands. This is called ?splinting.? Doing this helps protect your incision. It also decreases belly discomfort. ? ?If you are being admitted to the hospital overnight, leave your suitcase in the car. ?After surgery it may be brought to your room. ? ?If you are being  discharged the day of surgery, you will not be allowed to drive home. ?You will need a responsible adult (18 years or older) to drive you home and stay with you that night.  ? ?If you are taking public transportation, you will need to have a responsible adult (18 years or older) with you. ?Please confirm with your physician that it is acceptable to use public transportation.  ? ?Please call the [facility] at [phone] if you have any questions about these instructions. ? ?Surgery Visitation Policy: ? ?Patients undergoing a surgery or procedure may have two family members or support persons with them as long as the person is not COVID-19 positive or experiencing its symptoms.  ? ?

## 2021-09-27 ENCOUNTER — Encounter
Admission: RE | Admit: 2021-09-27 | Discharge: 2021-09-27 | Disposition: A | Discharge status: Home / Self Care | Payer: Medicare Other | Source: Ambulatory Visit | Attending: Surgery | Admitting: Surgery

## 2021-09-27 DIAGNOSIS — Z01810 Encounter for preprocedural cardiovascular examination: Secondary | ICD-10-CM | POA: Insufficient documentation

## 2021-09-27 DIAGNOSIS — I10 Essential (primary) hypertension: Secondary | ICD-10-CM | POA: Diagnosis not present

## 2021-09-29 ENCOUNTER — Ambulatory Visit: Payer: Medicare Other | Admitting: Certified Registered"

## 2021-09-29 ENCOUNTER — Encounter
Admission: RE | Disposition: A | Discharge status: Home / Self Care | Payer: Self-pay | Source: Home / Self Care | Attending: Surgery

## 2021-09-29 ENCOUNTER — Other Ambulatory Visit: Payer: Self-pay

## 2021-09-29 ENCOUNTER — Encounter: Payer: Self-pay | Admitting: Surgery

## 2021-09-29 ENCOUNTER — Ambulatory Visit
Admission: RE | Admit: 2021-09-29 | Discharge: 2021-09-29 | Disposition: A | Discharge status: Home / Self Care | Payer: Medicare Other | Attending: Surgery | Admitting: Surgery

## 2021-09-29 DIAGNOSIS — E875 Hyperkalemia: Secondary | ICD-10-CM | POA: Insufficient documentation

## 2021-09-29 DIAGNOSIS — M1712 Unilateral primary osteoarthritis, left knee: Secondary | ICD-10-CM | POA: Insufficient documentation

## 2021-09-29 DIAGNOSIS — X58XXXA Exposure to other specified factors, initial encounter: Secondary | ICD-10-CM | POA: Diagnosis not present

## 2021-09-29 DIAGNOSIS — Z79899 Other long term (current) drug therapy: Secondary | ICD-10-CM | POA: Diagnosis not present

## 2021-09-29 DIAGNOSIS — Z96652 Presence of left artificial knee joint: Secondary | ICD-10-CM | POA: Insufficient documentation

## 2021-09-29 DIAGNOSIS — M25062 Hemarthrosis, left knee: Secondary | ICD-10-CM | POA: Diagnosis present

## 2021-09-29 DIAGNOSIS — S83282A Other tear of lateral meniscus, current injury, left knee, initial encounter: Secondary | ICD-10-CM | POA: Insufficient documentation

## 2021-09-29 DIAGNOSIS — Z87891 Personal history of nicotine dependence: Secondary | ICD-10-CM | POA: Insufficient documentation

## 2021-09-29 DIAGNOSIS — K219 Gastro-esophageal reflux disease without esophagitis: Secondary | ICD-10-CM | POA: Insufficient documentation

## 2021-09-29 DIAGNOSIS — I10 Essential (primary) hypertension: Secondary | ICD-10-CM | POA: Diagnosis not present

## 2021-09-29 HISTORY — PX: KNEE ARTHROSCOPY: SHX127

## 2021-09-29 SURGERY — ARTHROSCOPY, KNEE
Anesthesia: General | Site: Knee | Laterality: Left

## 2021-09-29 MED ORDER — PROPOFOL 10 MG/ML IV BOLUS
INTRAVENOUS | Status: AC
  Filled 2021-09-29: qty 20

## 2021-09-29 MED ORDER — MIDAZOLAM HCL 2 MG/2ML IJ SOLN
INTRAMUSCULAR | Status: DC | PRN
  Administered 2021-09-29: 2 mg via INTRAVENOUS

## 2021-09-29 MED ORDER — PROPOFOL 10 MG/ML IV BOLUS
INTRAVENOUS | Status: DC | PRN
  Administered 2021-09-29 (×2): 100 mg via INTRAVENOUS

## 2021-09-29 MED ORDER — ACETAMINOPHEN 10 MG/ML IV SOLN
INTRAVENOUS | Status: DC | PRN
  Administered 2021-09-29: 1000 mg via INTRAVENOUS

## 2021-09-29 MED ORDER — KETOROLAC TROMETHAMINE 15 MG/ML IJ SOLN
INTRAMUSCULAR | Status: AC
  Administered 2021-09-29: 15 mg via INTRAVENOUS
  Filled 2021-09-29: qty 1

## 2021-09-29 MED ORDER — CEFAZOLIN SODIUM-DEXTROSE 2-4 GM/100ML-% IV SOLN
2.0000 g | INTRAVENOUS | Status: AC
  Administered 2021-09-29: 2 g via INTRAVENOUS

## 2021-09-29 MED ORDER — OXYCODONE HCL 5 MG PO TABS
ORAL_TABLET | ORAL | Status: AC
  Filled 2021-09-29: qty 1

## 2021-09-29 MED ORDER — LIDOCAINE HCL (CARDIAC) PF 100 MG/5ML IV SOSY
PREFILLED_SYRINGE | INTRAVENOUS | Status: DC | PRN
Start: 2021-09-29 — End: 2021-09-29
  Administered 2021-09-29: 100 mg via INTRAVENOUS

## 2021-09-29 MED ORDER — OXYCODONE HCL 5 MG/5ML PO SOLN: 5.0000 mg | Freq: Once | ORAL | Status: AC | PRN

## 2021-09-29 MED ORDER — ONDANSETRON HCL 4 MG PO TABS: 4.0000 mg | ORAL_TABLET | Freq: Four times a day (QID) | ORAL | Status: DC | PRN

## 2021-09-29 MED ORDER — DEXAMETHASONE SODIUM PHOSPHATE 10 MG/ML IJ SOLN
INTRAMUSCULAR | Status: DC | PRN
  Administered 2021-09-29: 5 mg via INTRAVENOUS

## 2021-09-29 MED ORDER — ONDANSETRON HCL 4 MG/2ML IJ SOLN
INTRAMUSCULAR | Status: DC | PRN
  Administered 2021-09-29: 4 mg via INTRAVENOUS

## 2021-09-29 MED ORDER — EPHEDRINE 5 MG/ML INJ
INTRAVENOUS | Status: AC
  Filled 2021-09-29: qty 5

## 2021-09-29 MED ORDER — ACETAMINOPHEN 10 MG/ML IV SOLN
INTRAVENOUS | Status: AC
  Filled 2021-09-29: qty 100

## 2021-09-29 MED ORDER — 0.9 % SODIUM CHLORIDE (POUR BTL) OPTIME
TOPICAL | Status: DC | PRN
  Administered 2021-09-29: 500 mL

## 2021-09-29 MED ORDER — ONDANSETRON HCL 4 MG/2ML IJ SOLN
INTRAMUSCULAR | Status: AC
Start: 2021-09-29 — End: ?
  Filled 2021-09-29: qty 2

## 2021-09-29 MED ORDER — CEFAZOLIN SODIUM-DEXTROSE 2-4 GM/100ML-% IV SOLN
INTRAVENOUS | Status: AC
  Filled 2021-09-29: qty 100

## 2021-09-29 MED ORDER — FENTANYL CITRATE (PF) 100 MCG/2ML IJ SOLN
INTRAMUSCULAR | Status: DC | PRN
  Administered 2021-09-29: 50 ug via INTRAVENOUS
  Administered 2021-09-29: 25 ug via INTRAVENOUS

## 2021-09-29 MED ORDER — SODIUM CHLORIDE 0.9 % IV SOLN: INTRAVENOUS | Status: DC

## 2021-09-29 MED ORDER — ONDANSETRON HCL 4 MG/2ML IJ SOLN: 4.0000 mg | Freq: Four times a day (QID) | INTRAMUSCULAR | Status: DC | PRN

## 2021-09-29 MED ORDER — KETOROLAC TROMETHAMINE 15 MG/ML IJ SOLN: 15.0000 mg | Freq: Once | INTRAMUSCULAR | Status: AC

## 2021-09-29 MED ORDER — FENTANYL CITRATE (PF) 100 MCG/2ML IJ SOLN
INTRAMUSCULAR | Status: AC
  Administered 2021-09-29: 25 ug via INTRAVENOUS
  Filled 2021-09-29: qty 2

## 2021-09-29 MED ORDER — FENTANYL CITRATE (PF) 100 MCG/2ML IJ SOLN
25.0000 ug | INTRAMUSCULAR | Status: DC | PRN
  Administered 2021-09-29: 25 ug via INTRAVENOUS

## 2021-09-29 MED ORDER — LACTATED RINGERS IV SOLN: INTRAVENOUS | Status: DC

## 2021-09-29 MED ORDER — MIDAZOLAM HCL 2 MG/2ML IJ SOLN
INTRAMUSCULAR | Status: AC
Start: 2021-09-29 — End: ?
  Filled 2021-09-29: qty 2

## 2021-09-29 MED ORDER — BUPIVACAINE-EPINEPHRINE (PF) 0.5% -1:200000 IJ SOLN
INTRAMUSCULAR | Status: AC
  Filled 2021-09-29: qty 30

## 2021-09-29 MED ORDER — METOCLOPRAMIDE HCL 5 MG/ML IJ SOLN: 5.0000 mg | Freq: Three times a day (TID) | INTRAMUSCULAR | Status: DC | PRN

## 2021-09-29 MED ORDER — EPHEDRINE SULFATE (PRESSORS) 50 MG/ML IJ SOLN
INTRAMUSCULAR | Status: DC | PRN
  Administered 2021-09-29: 10 mg via INTRAVENOUS
  Administered 2021-09-29: 5 mg via INTRAVENOUS

## 2021-09-29 MED ORDER — LIDOCAINE HCL (PF) 2 % IJ SOLN
INTRAMUSCULAR | Status: AC
  Filled 2021-09-29: qty 5

## 2021-09-29 MED ORDER — BUPIVACAINE-EPINEPHRINE (PF) 0.5% -1:200000 IJ SOLN
INTRAMUSCULAR | Status: DC | PRN
  Administered 2021-09-29: 20 mL via PERINEURAL
  Administered 2021-09-29: 30 mL via PERINEURAL

## 2021-09-29 MED ORDER — CHLORHEXIDINE GLUCONATE 0.12 % MT SOLN: 15.0000 mL | Freq: Once | OROMUCOSAL | Status: AC

## 2021-09-29 MED ORDER — ORAL CARE MOUTH RINSE: 15.0000 mL | Freq: Once | OROMUCOSAL | Status: AC

## 2021-09-29 MED ORDER — METOCLOPRAMIDE HCL 10 MG PO TABS: 5.0000 mg | ORAL_TABLET | Freq: Three times a day (TID) | ORAL | Status: DC | PRN

## 2021-09-29 MED ORDER — LIDOCAINE HCL 1 % IJ SOLN
INTRAMUSCULAR | Status: DC | PRN
  Administered 2021-09-29: 30 mL

## 2021-09-29 MED ORDER — IBUPROFEN 800 MG PO TABS: 800.0000 mg | ORAL_TABLET | Freq: Three times a day (TID) | ORAL | 1 refills | Status: AC | PRN

## 2021-09-29 MED ORDER — LIDOCAINE HCL (PF) 1 % IJ SOLN
INTRAMUSCULAR | Status: AC
  Filled 2021-09-29: qty 30

## 2021-09-29 MED ORDER — CHLORHEXIDINE GLUCONATE 0.12 % MT SOLN
OROMUCOSAL | Status: AC
  Administered 2021-09-29: 15 mL via OROMUCOSAL
  Filled 2021-09-29: qty 15

## 2021-09-29 MED ORDER — FENTANYL CITRATE (PF) 100 MCG/2ML IJ SOLN
INTRAMUSCULAR | Status: AC
  Filled 2021-09-29: qty 2

## 2021-09-29 MED ORDER — DEXAMETHASONE SODIUM PHOSPHATE 10 MG/ML IJ SOLN
INTRAMUSCULAR | Status: AC
  Filled 2021-09-29: qty 1

## 2021-09-29 MED ORDER — OXYCODONE HCL 5 MG PO TABS
5.0000 mg | ORAL_TABLET | Freq: Once | ORAL | Status: AC | PRN
  Administered 2021-09-29: 5 mg via ORAL

## 2021-09-29 SURGICAL SUPPLY — 42 items
"PENCIL ELECTRO HAND CTR " (MISCELLANEOUS) ×1 IMPLANT
APL PRP STRL LF DISP 70% ISPRP (MISCELLANEOUS) ×1
BAG COUNTER SPONGE SURGICOUNT (BAG) IMPLANT
BAG SPNG CNTER NS LX DISP (BAG)
BLADE FULL RADIUS 3.5 (BLADE) ×2 IMPLANT
BLADE SHAVER 4.5X7 STR FR (MISCELLANEOUS) ×2 IMPLANT
BNDG ELASTIC 6X5.8 VLCR STR LF (GAUZE/BANDAGES/DRESSINGS) ×2 IMPLANT
BNDG ESMARK 6X12 TAN STRL LF (GAUZE/BANDAGES/DRESSINGS) ×2 IMPLANT
CHLORAPREP W/TINT 26 (MISCELLANEOUS) ×2 IMPLANT
CUFF TOURN SGL QUICK 24 (TOURNIQUET CUFF)
CUFF TOURN SGL QUICK 34 (TOURNIQUET CUFF)
CUFF TRNQT CYL 24X4X16.5-23 (TOURNIQUET CUFF) IMPLANT
CUFF TRNQT CYL 34X4.125X (TOURNIQUET CUFF) IMPLANT
DRAPE ARTHRO LIMB 89X125 STRL (DRAPES) ×2 IMPLANT
DRAPE IMP U-DRAPE 54X76 (DRAPES) ×2 IMPLANT
ELECT REM PT RETURN 9FT ADLT (ELECTROSURGICAL) ×2
ELECTRODE REM PT RTRN 9FT ADLT (ELECTROSURGICAL) ×1 IMPLANT
GAUZE SPONGE 4X4 12PLY STRL (GAUZE/BANDAGES/DRESSINGS) ×2 IMPLANT
GLOVE SURG ENC MOIS LTX SZ8 (GLOVE) ×4 IMPLANT
GLOVE SURG ENC TEXT LTX SZ7 (GLOVE) ×4 IMPLANT
GLOVE SURG UNDER LTX SZ8 (GLOVE) ×2 IMPLANT
GLOVE SURG UNDER POLY LF SZ7.5 (GLOVE) ×2 IMPLANT
GOWN STRL REUS W/ TWL LRG LVL3 (GOWN DISPOSABLE) ×1 IMPLANT
GOWN STRL REUS W/ TWL XL LVL3 (GOWN DISPOSABLE) ×2 IMPLANT
GOWN STRL REUS W/TWL LRG LVL3 (GOWN DISPOSABLE) ×2
GOWN STRL REUS W/TWL XL LVL3 (GOWN DISPOSABLE) ×4
IV LACTATED RINGER IRRG 3000ML (IV SOLUTION) ×4
IV LR IRRIG 3000ML ARTHROMATIC (IV SOLUTION) ×1 IMPLANT
KIT TURNOVER KIT A (KITS) ×2 IMPLANT
MANIFOLD NEPTUNE II (INSTRUMENTS) ×4 IMPLANT
NDL HYPO 21X1.5 SAFETY (NEEDLE) ×1 IMPLANT
NEEDLE HYPO 21X1.5 SAFETY (NEEDLE) ×2 IMPLANT
PACK ARTHROSCOPY KNEE (MISCELLANEOUS) ×2 IMPLANT
PASSER SUT FIRSTPASS SELF (INSTRUMENTS) IMPLANT
PENCIL ELECTRO HAND CTR (MISCELLANEOUS) ×2 IMPLANT
SPONGE T-LAP 18X18 ~~LOC~~+RFID (SPONGE) ×2 IMPLANT
SUT PROLENE 4 0 PS 2 18 (SUTURE) ×2 IMPLANT
SUT TICRON COATED BLUE 2 0 30 (SUTURE) IMPLANT
SYR 50ML LL SCALE MARK (SYRINGE) ×2 IMPLANT
TUBING INFLOW SET DBFLO PUMP (TUBING) ×2 IMPLANT
WAND WEREWOLF FLOW 90D (MISCELLANEOUS) ×2 IMPLANT
WATER STERILE IRR 500ML POUR (IV SOLUTION) ×2 IMPLANT
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
## 2021-09-29 NOTE — Op Note (Signed)
09/29/2021 ? ?2:51 PM ? ?Patient:   Hector Bryant ? 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 ? ?Postoperative diagnosis:   Same with degenerative lateral meniscus tear, left knee. ? ?Procedure:   Extensive arthroscopic synovectomy with partial lateral meniscectomy and abrasion chondroplasty of grade 2-3 chondromalacial changes of femoral trochlea, left knee. ? ?Surgeon:   Pascal Lux, MD ? ?Anesthesia:   General LMA ? ?Findings:   As above.  The femoral and tibial components appear to be well seated and without evidence of loosening.  The anterior and posterior cruciate ligaments both are in satisfactory condition.  There were diffuse grade 2-3 chondromalacial changes involving the femoral trochlea and grade 1-2 chondromalacial changes involving the lateral tibial plateau, the lateral femoral condyle, and the patella. ? ?Complications:   None ? ?EBL:   5 cc. ? ?Total fluids:   700 cc of crystalloid. ? ?Tourniquet time:   None ? ?Drains:   None ? ?Closure:   4-0 Prolene interrupted sutures. ? ?Brief clinical note:   The patient is a 76 year old male who is now 15.5 months status post a left partial knee replacement. The patient did well for the first 9 months or so and then developed an acute hemarthrosis after an apparent trivial injury while doing some yard work. The symptoms finally abated and he again was doing well until this hemarthrosis again recurred about 3 weeks ago. The patient is quite frustrated by his persistent symptoms and function limitations. After discussing the potential treatment options, the patient presents at this time for arthroscopy, debridement, and cauterization of presumed friable and/or vascular tissues within the joint which may be contributing to his recurrent hemarthroses. ? ?Procedure:   The patient was brought into the operating room and lain in the supine position. After adequate general  laryngeal mask anesthesia was obtained, a timeout was performed to verify the appropriate side. The patient's left knee was injected sterilely using a solution of 30 cc of 1% lidocaine and 30 cc of 0.5% Sensorcaine with epinephrine. The left lower extremity was prepped with ChloraPrep solution before being draped sterilely. Preoperative antibiotics were administered. The expected portal sites were injected with 0.5% Sensorcaine with epinephrine before the camera was placed in the anterolateral portal and instrumentation performed through the anteromedial portal.  ? ?The knee was sequentially examined beginning in the suprapatellar pouch, then progressing to the patellofemoral space, the medial gutter and compartment, the notch, and finally the lateral compartment and gutter. The findings were as described above. Abundant reactive synovial tissues anteriorly, superiorly, medially, and laterally were debrided using the full-radius resector in order to improve visualization. The ArthroCare wand was introduced and used to obtain hemostasis as well as to continue debriding of synovitic tissues and the medial and lateral gutters and in the notch. Laterally, the degenerative the frayed central margin of the lateral meniscus was debrided back to stable margins using the full-radius resector. The full-radius sector also was used to perform an abrasion chondroplasty of the areas of grade 2-3 chondromalacial changes involving the femoral trochlear region. The instruments were removed from the joint after suctioning the excess fluid.  ? ?The portal sites were closed using 4-0 Prolene interrupted sutures before a sterile bulky dressing was applied to the knee. The patient was then awakened, extubated, and returned to the recovery room in satisfactory condition after tolerating the procedure well. ?

## 2021-09-29 NOTE — Anesthesia Procedure Notes (Signed)
Procedure Name: LMA Insertion ?Date/Time: 09/29/2021 1:12 PM ?Performed by: Cammie Sickle, CRNA ?Pre-anesthesia Checklist: Patient identified, Patient being monitored, Timeout performed, Emergency Drugs available and Suction available ?Patient Re-evaluated:Patient Re-evaluated prior to induction ?Oxygen Delivery Method: Circle system utilized ?Preoxygenation: Pre-oxygenation with 100% oxygen ?Induction Type: IV induction ?Ventilation: Mask ventilation without difficulty ?LMA: LMA inserted ?LMA Size: 4.0 ?Tube type: Oral ?Number of attempts: 1 ?Placement Confirmation: positive ETCO2 and breath sounds checked- equal and bilateral ?Tube secured with: Tape ?Dental Injury: Teeth and Oropharynx as per pre-operative assessment  ? ? ? ? ?

## 2021-09-29 NOTE — Discharge Instructions (Addendum)
Orthopedic discharge instructions: ?Keep dressing dry and intact.  ?May shower after dressing changed on post-op day #4 (Sunday).  ?Cover staples/sutures with Band-Aids after drying off. ?Apply ice frequently to knee. ?Take Mobic 15 mg daily OR ibuprofen 600-800 mg TID with meals for 3-5 days, then as necessary. ?Take ES Tylenol when needed.  ?May weight-bear as tolerated - use crutches or walker as needed for balance. ?Follow-up in 10-14 days or as scheduled. ? ?AMBULATORY SURGERY  ?DISCHARGE INSTRUCTIONS ? ? ?The drugs that you were given will stay in your system until tomorrow so for the next 24 hours you should not: ? ?Drive an automobile ?Make any legal decisions ?Drink any alcoholic beverage ? ? ?You may resume regular meals tomorrow.  Today it is better to start with liquids and gradually work up to solid foods. ? ?You may eat anything you prefer, but it is better to start with liquids, then soup and crackers, and gradually work up to solid foods. ? ? ?Please notify your doctor immediately if you have any unusual bleeding, trouble breathing, redness and pain at the surgery site, drainage, fever, or pain not relieved by medication. ? ? ? ?Additional Instructions: ? ? ? ? ? ? ? ?Please contact your physician with any problems or Same Day Surgery at [phone], Monday through Friday 6 am to 4 pm, or [place] at [facility] number at [phone].  ?
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
## 2021-09-29 NOTE — Anesthesia Postprocedure Evaluation (Signed)
Anesthesia Post Note ? ?Patient: Hector Bryant ? ?Procedure(s) Performed: KNEE ARTHROSCOPY WITH DEBRIDEMENT, (Left: Knee) ? ?Patient location during evaluation: PACU ?Anesthesia Type: General ?Level of consciousness: awake and alert ?Pain management: pain level controlled ?Vital Signs Assessment: post-procedure vital signs reviewed and stable ?Respiratory status: spontaneous breathing, nonlabored ventilation, respiratory function stable and patient connected to nasal cannula oxygen ?Cardiovascular status: blood pressure returned to baseline and stable ?Postop Assessment: no apparent nausea or vomiting ?Anesthetic complications: no ? ? ?No notable events documented. ? ? ?Last Vitals:  ?Vitals:  ? 09/29/21 1445 09/29/21 1500  ?BP: (!) 166/67 (!) 161/73  ?Pulse: 93 91  ?Resp: 14 18  ?Temp:    ?SpO2: 95% 96%  ?  ?Last Pain:  ?Vitals:  ? 09/29/21 1500  ?TempSrc:   ?PainSc: 8   ? ? ?  ?  ?  ?  ?  ?  ? ?Arita Miss ? ? ? ? ?

## 2021-09-29 NOTE — Transfer of Care (Signed)
Immediate Anesthesia Transfer of Care Note ? ?Patient: Hector Bryant ? ?Procedure(s) Performed: KNEE ARTHROSCOPY WITH DEBRIDEMENT, (Left: Knee) ? ?Patient Location: PACU ? ?Anesthesia Type:General ? ?Level of Consciousness: drowsy ? ?Airway & Oxygen Therapy: Patient Spontanous Breathing and Patient connected to face mask oxygen ? ?Post-op Assessment: Report given to RN and Post -op Vital signs reviewed and stable ? ?Post vital signs: Reviewed and stable ? ?Last Vitals:  ?Vitals Value Taken Time  ?BP 166/67 09/29/21 1445  ?Temp 36.1 ?C 09/29/21 1441  ?Pulse 89 09/29/21 1453  ?Resp 16 09/29/21 1453  ?SpO2 96 % 09/29/21 1453  ?Vitals shown include unvalidated device data. ? ?Last Pain:  ?Vitals:  ? 09/29/21 1441  ?TempSrc:   ?PainSc: Asleep  ?   ? ?  ? ?Complications: No notable events documented. ?

## 2021-09-30 ENCOUNTER — Encounter: Payer: Self-pay | Admitting: Surgery

## 2021-10-04 ENCOUNTER — Other Ambulatory Visit: Payer: Medicare Other

## 2021-12-06 ENCOUNTER — Other Ambulatory Visit: Payer: Self-pay

## 2021-12-06 ENCOUNTER — Emergency Department
Admission: EM | Admit: 2021-12-06 | Discharge: 2021-12-06 | Disposition: A | Discharge status: Home / Self Care | Payer: Medicare Other | Attending: Emergency Medicine | Admitting: Emergency Medicine

## 2021-12-06 ENCOUNTER — Encounter: Payer: Self-pay | Admitting: Emergency Medicine

## 2021-12-06 DIAGNOSIS — M25462 Effusion, left knee: Secondary | ICD-10-CM | POA: Diagnosis not present

## 2021-12-06 DIAGNOSIS — M25562 Pain in left knee: Secondary | ICD-10-CM | POA: Diagnosis present

## 2021-12-06 LAB — CBC
HCT: 37.8 % — ABNORMAL LOW (ref 39.0–52.0)
Hemoglobin: 12.6 g/dL — ABNORMAL LOW (ref 13.0–17.0)
MCH: 31.6 pg (ref 26.0–34.0)
MCHC: 33.3 g/dL (ref 30.0–36.0)
MCV: 94.7 fL (ref 80.0–100.0)
Platelets: 195 10*3/uL (ref 150–400)
RBC: 3.99 MIL/uL — ABNORMAL LOW (ref 4.22–5.81)
RDW: 13.5 % (ref 11.5–15.5)
WBC: 5.6 10*3/uL (ref 4.0–10.5)
nRBC: 0 % (ref 0.0–0.2)

## 2021-12-06 LAB — BASIC METABOLIC PANEL
Anion gap: 4 — ABNORMAL LOW (ref 5–15)
BUN: 16 mg/dL (ref 8–23)
CO2: 27 mmol/L (ref 22–32)
Calcium: 9.6 mg/dL (ref 8.9–10.3)
Chloride: 108 mmol/L (ref 98–111)
Creatinine, Ser: 1.16 mg/dL (ref 0.61–1.24)
GFR, Estimated: >60 mL/min (ref >60)
Glucose, Bld: 110 mg/dL — ABNORMAL HIGH (ref 70–99)
Potassium: 4.2 mmol/L (ref 3.5–5.1)
Sodium: 139 mmol/L (ref 135–145)

## 2021-12-06 NOTE — ED Notes (Signed)
See triage note  presents with swelling and pain to left knee  denies any recent injury  states he has had to have fluids drained in past  ambulates with slight limp

## 2021-12-06 NOTE — Discharge Instructions (Addendum)
Follow-up Tuesday or Wednesday with [employer]. Call tomorrow to setup an ER visit follow-up and appointment for Tuesday or Wednesday with Dr. Roland Rack, Eston Mould, or Dr. Posey Pronto.   Return to the ER right away if you have a fever, severe pain, the joint appears red, or other new concerns arise.  No driving while taking perscription pain medication.

## 2021-12-06 NOTE — ED Provider Notes (Signed)
[facility] Provider Note    Event Date/Time   First MD Initiated Contact with Patient 12/06/21 1625     (approximate)   History   Knee Pain   HPI  Hector Bryant is a 76 y.o. 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  Reviewed notes by Dr. Roland Rack  Patient reports that he played softball Saturday stays very active.  Started noticed a little bit of a discomfort a day later, but he did not have any injury during the game.  Now he reports his left knee is starting to swell up again not as severe as it has in the past, but it started to cause aching and pain and he tried to see orthopedics with Dr. Roland Rack on vacation today.  He is concerned that he is starting to have bleeding again in the left knee.  No fevers no chills the skin has not been red he has not had any sort of feeling of infection.  He reports he had the same symptoms about 3 other times due to bleeding in the knee joint spontaneously.  Currently takes meloxicam for pain relief.  No numbness or weakness in the left leg.  He is still able to walk on it but feels sore and slightly tight feeling started develop, but not as severe as previous occurrence and reports the pain is not severe at this time.     Physical Exam   Triage Vital Signs: ED Triage Vitals  Enc Vitals Group     BP 12/06/21 1451 (!) 154/75     Pulse Rate 12/06/21 1451 73     Resp 12/06/21 1451 16     Temp --      Temp src --      SpO2 12/06/21 1451 98 %     Weight 12/06/21 1451 160 lb (72.6 kg)     Height 12/06/21 1451 '5\' 6"'$  (1.676 m)     Head Circumference --      Peak Flow --      Pain Score 12/06/21 1450 10     Pain Loc --      Pain Edu? --      Excl. in [place]? --     Most recent vital signs: Vitals:   12/06/21 1451 12/06/21 1638  BP: (!) 154/75 (!) 150/75  Pulse: 73   Resp: 16 16  Temp:  98 F (36.7 C)  SpO2: 98% 98%     General: Awake, no distress.   CV:  Good peripheral perfusion.  Strong dorsalis pedis pulse left foot well-perfused. Resp:  Normal effort.  Abd:  No distention.  Other:    Lower Extremities  No edema. Normal DP/PT pulses bilateral with good cap refill left foot.  Normal neuro-motor function left  RIGHT Grossly normal appearance.  LEFT Left lower extremity demonstrates normal strength, good use of all muscles though some pain and discomfort to range of motion of the left knee. No edema bruising or contusions of the hip,  knee, ankle. Full range of motion of the left lower extremity without pain. No pain on axial loading. No evidence of trauma.      ED Results / Procedures / Treatments   Labs (all labs ordered are listed, but only abnormal results are displayed) Labs Reviewed  CBC - Abnormal; Notable for the following components:      Result Value   RBC 3.99 (*)    Hemoglobin 12.6 (*)  HCT 37.8 (*)    All other components within normal limits  BASIC METABOLIC PANEL - Abnormal; Notable for the following components:   Glucose, Bld 110 (*)    Anion gap 4 (*)    All other components within normal limits     EKG     RADIOLOGY     PROCEDURES:  Critical Care performed: No  Procedures   MEDICATIONS ORDERED IN ED: Medications - No data to display   IMPRESSION / MDM / [place] / ED COURSE  I reviewed the triage vital signs and the nursing notes.                              Differential diagnosis includes, but is not limited to, left knee joint effusion, hemarthrosis, infection (clinically appears quite unlikely), internal injury, soft tissue injury.  No clinical evidence by history or exam of bony fracture or lesion.  Patient's presentation is most consistent with acute complicated illness / injury requiring diagnostic workup.   Afebrile, normal white count.  No clinical findings of suggest infection.  ----------------------------------------- 4:54 PM on  12/06/2021 ----------------------------------------- Discussed the case with Dr. Leim Fabry, and then also with Dr. Roland Rack.  Both recommending the patient follow-up Tuesday or Wednesday this week in the clinic and that they would be able to schedule him in.  Discussed with the patient, he is amenable to this plan.  Offered prescription pain medicine patient declines this, rather wishes to follow-up Tuesday or Wednesday in orthopedic clinic.  He will call to make an appointment tomorrow morning and understands that his scheduler at the clinic will find a time slot for him to be seen in the next 48 hours.  Return precautions and treatment recommendations and follow-up discussed with the patient who is agreeable with the plan.  Patient has knee brace at home which she will utilize until then.   FINAL CLINICAL IMPRESSION(S) / ED DIAGNOSES   Final diagnoses:  Effusion of left knee     Rx / DC Orders   ED Discharge Orders     None        Note:  This document was prepared using Dragon voice recognition software and may include unintentional dictation errors.   Delman Kitten, MD 12/06/21 1655

## 2021-12-06 NOTE — ED Triage Notes (Signed)
Pt here with left knee pain. Pt had a knee replacement in Dec 2021 and has had repeat episodes where his knee was filing up with fluid which has been drained. Pt states everything was fine until today when he woke up to his knee being swollen again. Pt still able to ambulate.

## 2022-06-18 ENCOUNTER — Ambulatory Visit
Admission: RE | Admit: 2022-06-18 | Discharge: 2022-06-18 | Disposition: A | Discharge status: Home / Self Care | Payer: Medicare Other | Source: Ambulatory Visit | Attending: Physician Assistant | Admitting: Physician Assistant

## 2022-06-18 VITALS — BP 145/78 | HR 86 | Temp 98.9°F | Resp 15 | Ht 66.0 in | Wt 160.0 lb

## 2022-06-18 DIAGNOSIS — U071 COVID-19: Secondary | ICD-10-CM | POA: Diagnosis not present

## 2022-06-18 DIAGNOSIS — Z20822 Contact with and (suspected) exposure to covid-19: Secondary | ICD-10-CM | POA: Diagnosis not present

## 2022-06-18 DIAGNOSIS — R051 Acute cough: Secondary | ICD-10-CM

## 2022-06-18 DIAGNOSIS — R0981 Nasal congestion: Secondary | ICD-10-CM | POA: Diagnosis not present

## 2022-06-18 LAB — SARS CORONAVIRUS 2 BY RT PCR: SARS Coronavirus 2 by RT PCR: POSITIVE — AB

## 2022-06-18 MED ORDER — NIRMATRELVIR/RITONAVIR (PAXLOVID)TABLET: 3.0000 | ORAL_TABLET | Freq: Two times a day (BID) | ORAL | 0 refills | Status: AC
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
## 2022-06-18 NOTE — ED Triage Notes (Signed)
Patient c/o cough, runny nose, headache and bodyaches that started on Tuesday.  Patient states that his wife first test was positive for COVID and the second one was negative.  Patient would like a covid test today.

## 2022-06-18 NOTE — Discharge Instructions (Signed)
-  COVID test is positive.  You need to isolate until Monday and then wear a mask for 5 days.  I have sent Paxlovid medication to the pharmacy.  You need to make sure not to take your atorvastatin for 7 to 10 days from this point forward.  After that you may resume it. - Any rest and fluids and over-the-counter cough medication as needed.  Tylenol as needed for any fever, aches or discomfort. - Need to be seen again if you have any uncontrolled fever, weakness or breathing difficulty.

## 2022-07-19 ENCOUNTER — Ambulatory Visit
Admission: EM | Admit: 2022-07-19 | Discharge: 2022-07-19 | Payer: Medicare Other | Attending: Emergency Medicine | Admitting: Emergency Medicine

## 2022-07-19 ENCOUNTER — Encounter: Payer: Self-pay | Admitting: Emergency Medicine

## 2022-07-19 DIAGNOSIS — R42 Dizziness and giddiness: Secondary | ICD-10-CM | POA: Diagnosis present

## 2022-07-19 DIAGNOSIS — R7989 Other specified abnormal findings of blood chemistry: Secondary | ICD-10-CM | POA: Insufficient documentation

## 2022-07-19 DIAGNOSIS — R9431 Abnormal electrocardiogram [ECG] [EKG]: Secondary | ICD-10-CM | POA: Insufficient documentation

## 2022-07-19 LAB — CBC WITH DIFFERENTIAL/PLATELET
Abs Immature Granulocytes: 0.04 10*3/uL (ref 0.00–0.07)
Basophils Absolute: 0 10*3/uL (ref 0.0–0.1)
Basophils Relative: 0 %
Eosinophils Absolute: 0.1 10*3/uL (ref 0.0–0.5)
Eosinophils Relative: 1 %
HCT: 33.8 % — ABNORMAL LOW (ref 39.0–52.0)
Hemoglobin: 11.9 g/dL — ABNORMAL LOW (ref 13.0–17.0)
Immature Granulocytes: 0 %
Lymphocytes Relative: 11 %
Lymphs Abs: 1 10*3/uL (ref 0.7–4.0)
MCH: 32.9 pg (ref 26.0–34.0)
MCHC: 35.2 g/dL (ref 30.0–36.0)
MCV: 93.4 fL (ref 80.0–100.0)
Monocytes Absolute: 0.9 10*3/uL (ref 0.1–1.0)
Monocytes Relative: 10 %
Neutro Abs: 7 10*3/uL (ref 1.7–7.7)
Neutrophils Relative %: 78 %
Platelets: 157 10*3/uL (ref 150–400)
RBC: 3.62 MIL/uL — ABNORMAL LOW (ref 4.22–5.81)
RDW: 14.9 % (ref 11.5–15.5)
WBC: 8.9 10*3/uL (ref 4.0–10.5)
nRBC: 0 % (ref 0.0–0.2)

## 2022-07-19 LAB — BASIC METABOLIC PANEL
Anion gap: 8 (ref 5–15)
BUN: 25 mg/dL — ABNORMAL HIGH (ref 8–23)
CO2: 28 mmol/L (ref 22–32)
Calcium: 8.9 mg/dL (ref 8.9–10.3)
Chloride: 106 mmol/L (ref 98–111)
Creatinine, Ser: 1.34 mg/dL — ABNORMAL HIGH (ref 0.61–1.24)
GFR, Estimated: 54 mL/min — ABNORMAL LOW (ref >60)
Glucose, Bld: 117 mg/dL — ABNORMAL HIGH (ref 70–99)
Potassium: 3.6 mmol/L (ref 3.5–5.1)
Sodium: 142 mmol/L (ref 135–145)
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
## 2022-07-19 NOTE — ED Notes (Signed)
Patient is being discharged from the Urgent Care and sent to the Emergency Department via personal vehicle . Per Dr. Alphonzo Cruise, patient is in need of higher level of care due to dizziness and nausea. Patient is aware and verbalizes understanding of plan of care.  Vitals:   07/19/22 1743  BP: (!) 146/62  Pulse: 97  Temp: 99 F (37.2 C)  SpO2: 98%

## 2022-07-19 NOTE — Discharge Instructions (Signed)
I suspect that you are somewhat dehydrated as you have an elevated BUN/creatinine, and you are almost orthostatic.  Your kidneys are not working as well as they were in April 2023.  You also have some new EKG changes that are concerning for a problem with your heart.  He is to go to the emergency department for further evaluation.

## 2022-07-19 NOTE — ED Triage Notes (Signed)
Pt c/o dizziness, pt reports dizziness when getting up x couple days, pt reports hx of vertigo a long time ago, denies any blurred vision. Pt states he noticed his heart rate elevated x couple days ago. Pt's wife states she took his BP today and reading 162/66, pt reports he feels "woozy" like he would just like to sleep. Pt denies any chest pain, was nauseated the other night 7 this morning

## 2022-09-28 ENCOUNTER — Ambulatory Visit
Admission: EM | Admit: 2022-09-28 | Discharge: 2022-09-28 | Disposition: A | Discharge status: Home / Self Care | Payer: Medicare Other

## 2022-09-28 NOTE — ED Provider Notes (Signed)
   Quick assessment performed by myself and Dr. Susa Simmonds.   77 y/o male presenting for 2 lacerations of the left dorsal hand.  There is also a puncture wound.  Patient reports that he was bitten by his 4-year-old dog this morning.  Laceration is deep.  Unable to visualize the full depth but suspecting exposed bone.  Patient directed to the emergency department immediately after hand was wrapped with gauze and Coban.  Patient proceeding to [facility] at this time.  Wife is taking him.   Danton Clap, PA-C 09/28/22 [phone]

## 2022-09-28 NOTE — ED Notes (Signed)
Patient is being discharged from the Urgent Care and sent to the Emergency Department via Personal Vehicle . Per Dr.Brimage, patient is in need of higher level of care due to Dog Bite. Patient is aware and verbalizes understanding of plan of care. There were no vitals filed for this visit.

## 2022-09-28 NOTE — ED Triage Notes (Signed)
Pt c/o dog bite to left hand  Pt has 1 lacerations along the top of the left hand stretching from base of ring finger to mid hand. Pt has another laceration along the base of hand on top of the wrist. Pt has a puncture on the palm.  Pt states he was bit by his own dog. Pt states that his dog is a 79 year old [place] Spaniel.

## 2022-12-28 IMAGING — DX DG KNEE COMPLETE 4+V*L*
4 series · 4 of 4 positions shown · non-contrast
Comparison: CT 06/23/2021

CLINICAL DATA: Swelling.  Unable to straighten knee.

EXAM:
LEFT KNEE - COMPLETE 4+ VIEW

[knee ap]
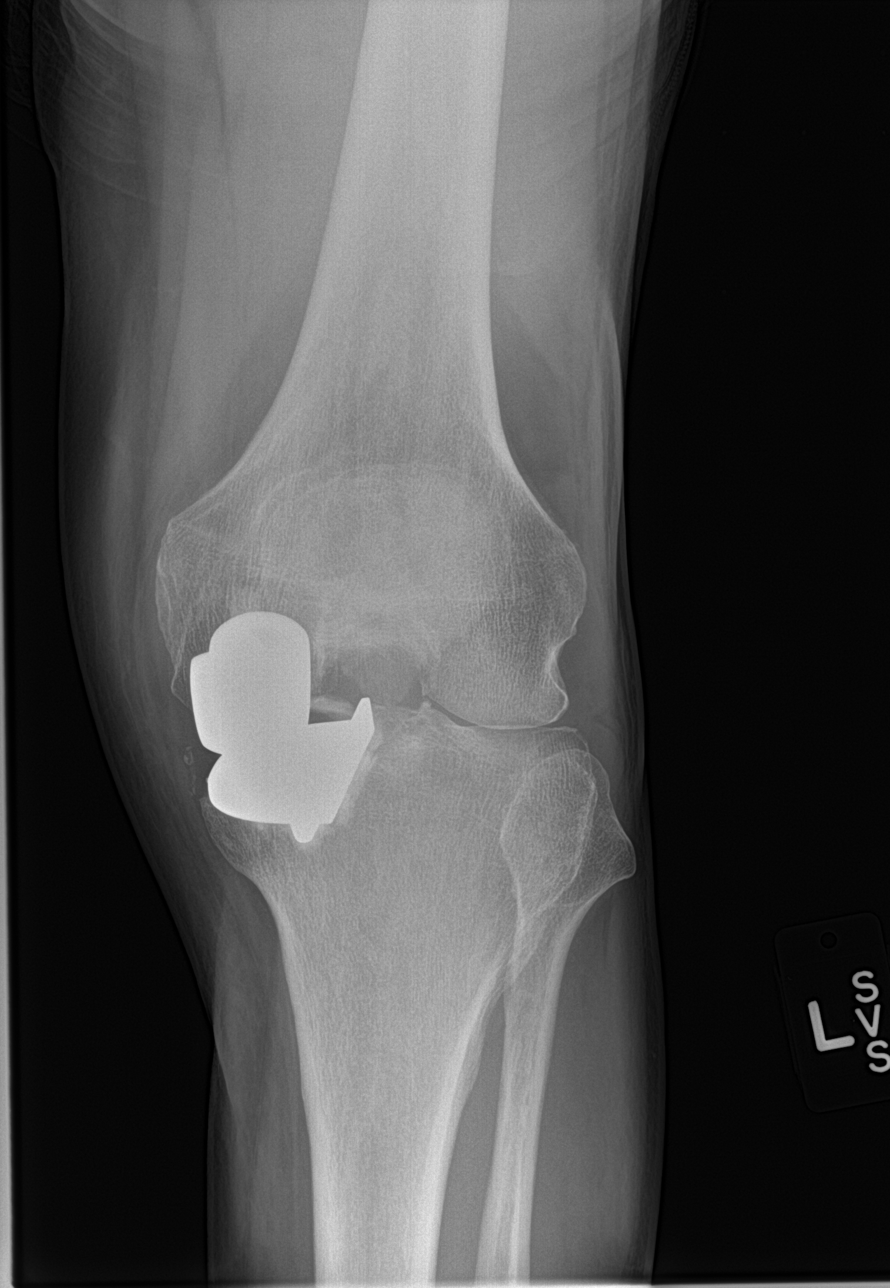

[knee tunnel]
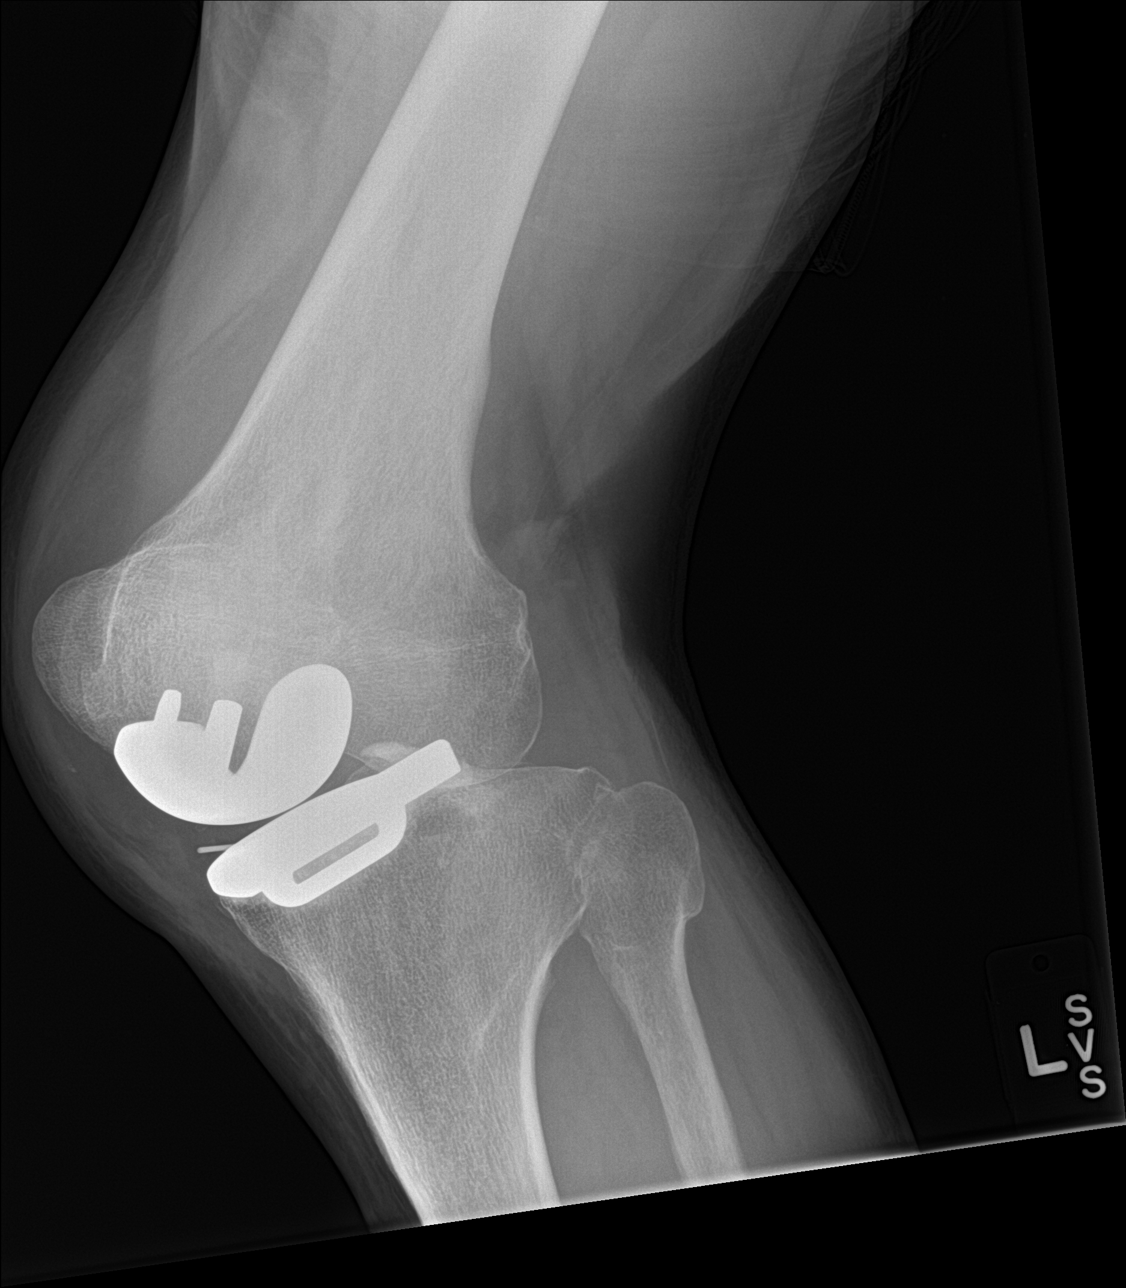

[knee lat]
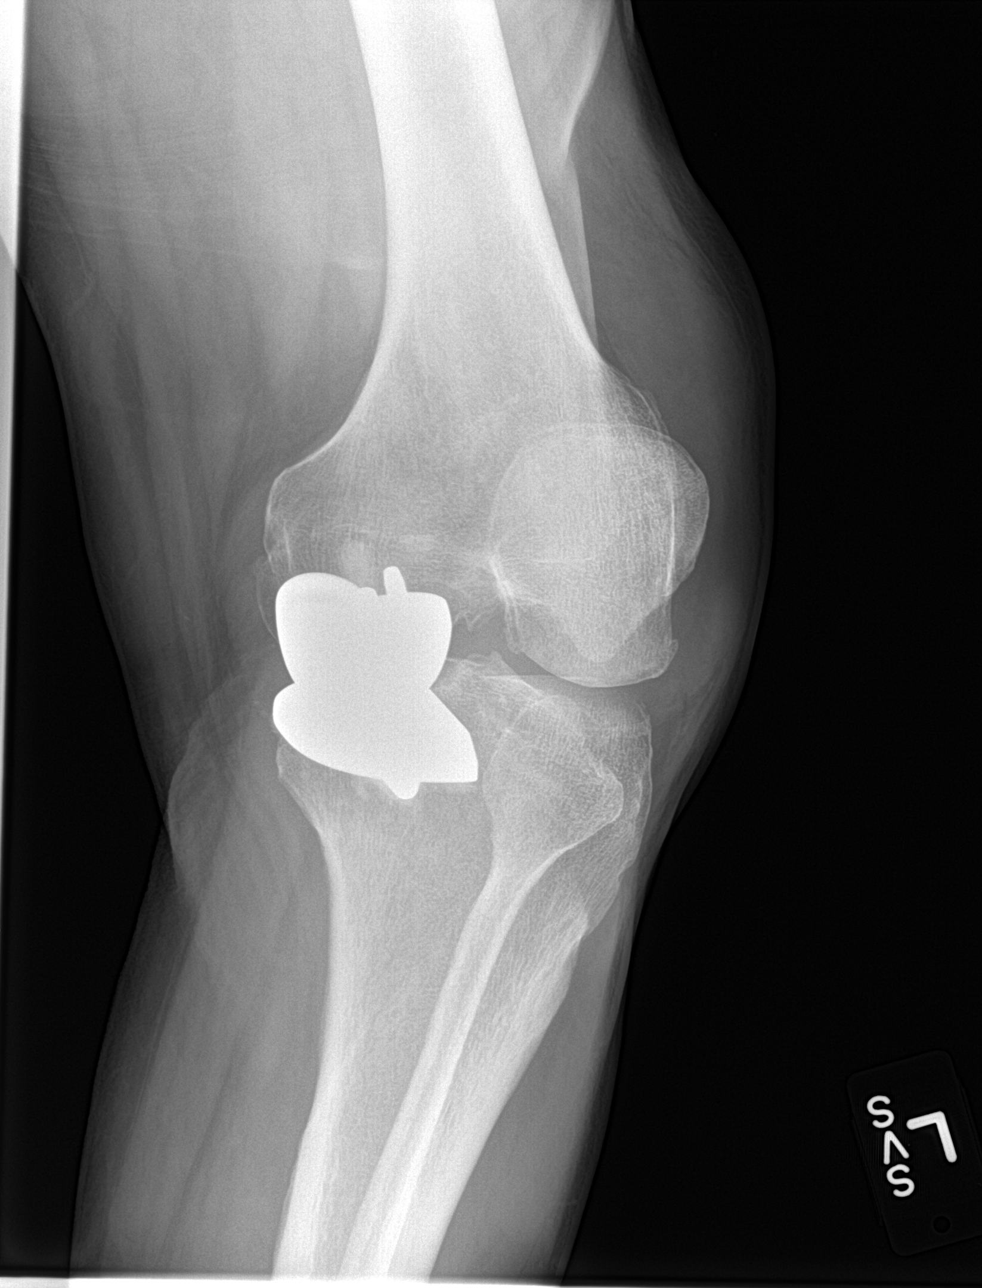

[patella skyline]
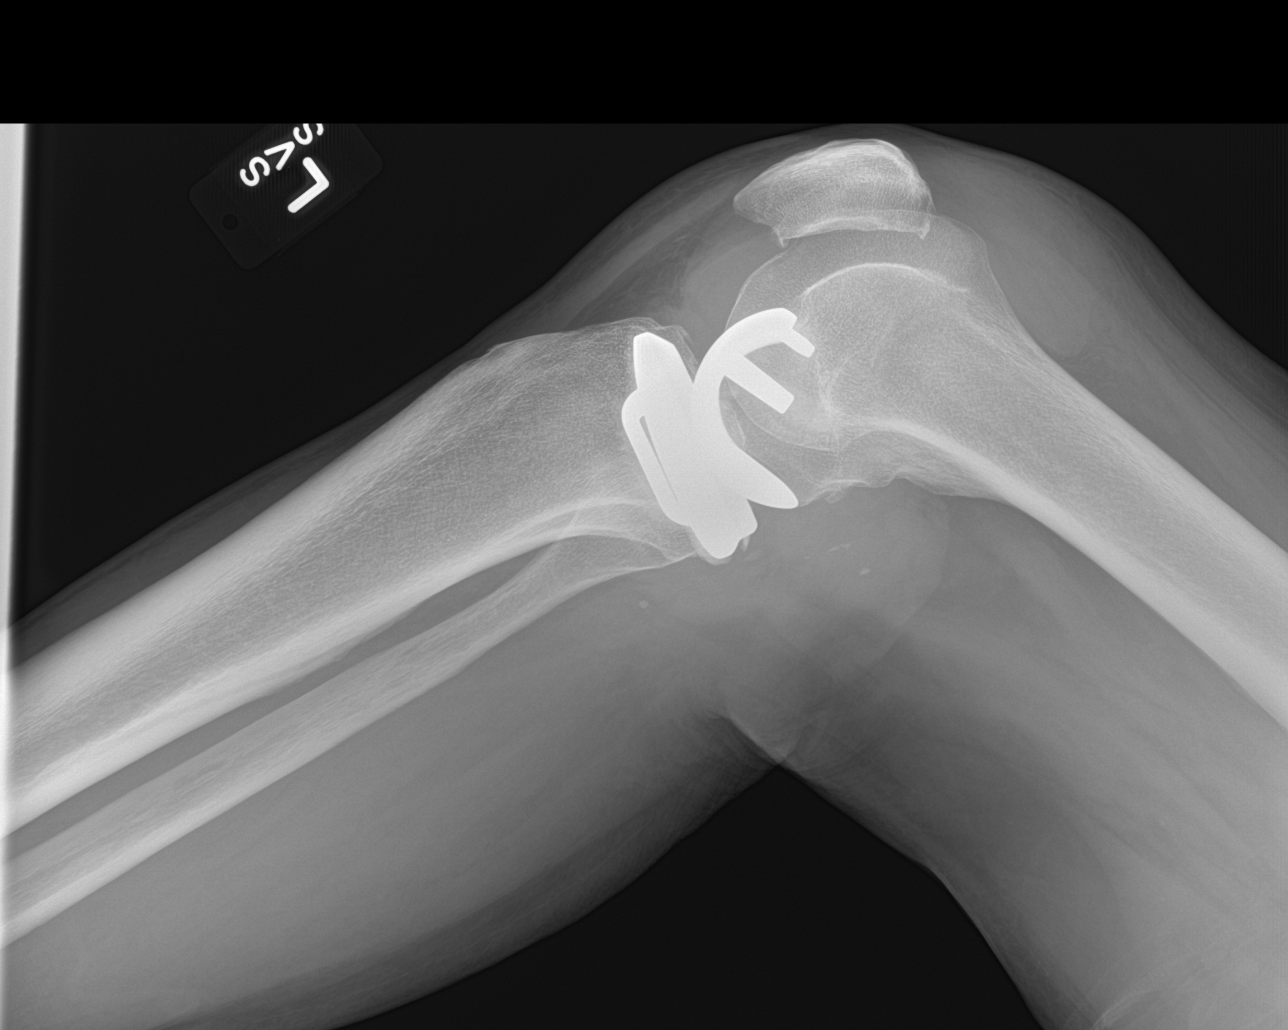

[4 of 4 positions shown; findings below may reference images not displayed]

FINDINGS: Medial compartment hemiarthroplasty. Allowing for difficulty with
positioning, hardware alignment is maintained. There is mild
patellofemoral osteoarthritis with spurring. Moderate knee joint
effusion. Soft tissue edema anteriorly. No fracture or erosion or
bone destruction.
IMPRESSION: 1. Medial compartment hemiarthroplasty without gross complication,
evaluation is limited due to difficulties with positioning.
2. Moderate knee joint effusion.  Generalized soft tissue edema.
